# Patient Record
Sex: Female | Born: 1940
Health system: Southern US, Community
[De-identification: ages and names within clinical notes are randomized; demographics above are authoritative.]

## PROBLEM LIST (undated history)

## (undated) DIAGNOSIS — N189 Chronic kidney disease, unspecified: Secondary | ICD-10-CM

## (undated) DIAGNOSIS — K259 Gastric ulcer, unspecified as acute or chronic, without hemorrhage or perforation: Secondary | ICD-10-CM

## (undated) DIAGNOSIS — M5136 Other intervertebral disc degeneration, lumbar region: Secondary | ICD-10-CM

## (undated) DIAGNOSIS — M7580 Other shoulder lesions, unspecified shoulder: Secondary | ICD-10-CM

## (undated) DIAGNOSIS — F32A Depression, unspecified: Secondary | ICD-10-CM

## (undated) DIAGNOSIS — R920 Mammographic microcalcification found on diagnostic imaging of breast: Secondary | ICD-10-CM

## (undated) DIAGNOSIS — N6099 Unspecified benign mammary dysplasia of unspecified breast: Secondary | ICD-10-CM

## (undated) DIAGNOSIS — M1990 Unspecified osteoarthritis, unspecified site: Secondary | ICD-10-CM

## (undated) DIAGNOSIS — E785 Hyperlipidemia, unspecified: Secondary | ICD-10-CM

## (undated) DIAGNOSIS — Z780 Asymptomatic menopausal state: Secondary | ICD-10-CM

## (undated) DIAGNOSIS — K219 Gastro-esophageal reflux disease without esophagitis: Secondary | ICD-10-CM

## (undated) DIAGNOSIS — F329 Major depressive disorder, single episode, unspecified: Secondary | ICD-10-CM

## (undated) DIAGNOSIS — M51369 Other intervertebral disc degeneration, lumbar region without mention of lumbar back pain or lower extremity pain: Secondary | ICD-10-CM

## (undated) DIAGNOSIS — I10 Essential (primary) hypertension: Secondary | ICD-10-CM

## (undated) HISTORY — DX: Asymptomatic menopausal state: Z78.0

## (undated) HISTORY — DX: Other shoulder lesions, unspecified shoulder: M75.80

## (undated) HISTORY — DX: Depression, unspecified: F32.A

## (undated) HISTORY — DX: Unspecified osteoarthritis, unspecified site: M19.90

## (undated) HISTORY — DX: Hyperlipidemia, unspecified: E78.5

## (undated) HISTORY — DX: Gastro-esophageal reflux disease without esophagitis: K21.9

## (undated) HISTORY — DX: Unspecified benign mammary dysplasia of unspecified breast: N60.99

## (undated) HISTORY — DX: Chronic kidney disease, unspecified: N18.9

## (undated) HISTORY — DX: Other intervertebral disc degeneration, lumbar region: M51.36

## (undated) HISTORY — DX: Gastric ulcer, unspecified as acute or chronic, without hemorrhage or perforation: K25.9

## (undated) HISTORY — DX: Other intervertebral disc degeneration, lumbar region without mention of lumbar back pain or lower extremity pain: M51.369

## (undated) HISTORY — DX: Mammographic microcalcification found on diagnostic imaging of breast: R92.0

## (undated) HISTORY — DX: Essential (primary) hypertension: I10

---
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
## 1991-08-15 DIAGNOSIS — I10 Essential (primary) hypertension: Secondary | ICD-10-CM

## 1991-08-15 HISTORY — DX: Essential (primary) hypertension: I10

## 1997-08-14 DIAGNOSIS — M1990 Unspecified osteoarthritis, unspecified site: Secondary | ICD-10-CM

## 1997-08-14 HISTORY — DX: Unspecified osteoarthritis, unspecified site: M19.90

## 2006-06-06 ENCOUNTER — Ambulatory Visit: Payer: Self-pay | Admitting: Gastroenterology

## 2007-09-07 ENCOUNTER — Emergency Department: Payer: Self-pay | Admitting: Emergency Medicine

## 2009-04-06 ENCOUNTER — Emergency Department: Payer: Self-pay | Admitting: Unknown Physician Specialty

## 2010-10-17 LAB — HM DEXA SCAN: HM Dexa Scan: NORMAL

## 2011-08-15 DIAGNOSIS — R920 Mammographic microcalcification found on diagnostic imaging of breast: Secondary | ICD-10-CM

## 2011-08-15 HISTORY — PX: BREAST EXCISIONAL BIOPSY: SUR124

## 2011-08-15 HISTORY — DX: Mammographic microcalcification found on diagnostic imaging of breast: R92.0

## 2011-12-11 ENCOUNTER — Ambulatory Visit: Payer: Self-pay | Admitting: General Surgery

## 2011-12-13 DIAGNOSIS — N6099 Unspecified benign mammary dysplasia of unspecified breast: Secondary | ICD-10-CM

## 2011-12-13 HISTORY — DX: Unspecified benign mammary dysplasia of unspecified breast: N60.99

## 2011-12-14 LAB — PATHOLOGY REPORT

## 2011-12-25 ENCOUNTER — Ambulatory Visit: Payer: Self-pay | Admitting: General Surgery

## 2011-12-28 ENCOUNTER — Ambulatory Visit: Payer: Self-pay | Admitting: General Surgery

## 2012-01-01 LAB — PATHOLOGY REPORT

## 2012-09-12 ENCOUNTER — Encounter: Payer: Self-pay | Admitting: *Deleted

## 2012-09-12 DIAGNOSIS — R920 Mammographic microcalcification found on diagnostic imaging of breast: Secondary | ICD-10-CM | POA: Insufficient documentation

## 2012-09-12 DIAGNOSIS — I10 Essential (primary) hypertension: Secondary | ICD-10-CM | POA: Insufficient documentation

## 2012-09-20 ENCOUNTER — Encounter: Payer: Self-pay | Admitting: General Surgery

## 2012-11-04 ENCOUNTER — Ambulatory Visit (INDEPENDENT_AMBULATORY_CARE_PROVIDER_SITE_OTHER): Payer: Medicare Other | Admitting: General Surgery

## 2012-11-04 ENCOUNTER — Encounter: Payer: Self-pay | Admitting: General Surgery

## 2012-11-04 VITALS — BP 115/80 | HR 80 | Resp 14 | Ht 65.0 in | Wt 184.2 lb

## 2012-11-04 DIAGNOSIS — N6089 Other benign mammary dysplasias of unspecified breast: Secondary | ICD-10-CM

## 2012-11-04 DIAGNOSIS — N6099 Unspecified benign mammary dysplasia of unspecified breast: Secondary | ICD-10-CM

## 2012-11-04 NOTE — Progress Notes (Signed)
Patient ID: Veronica Rowe, female   DOB: [birth year], 72 y.o.   MRN: [id number]  Chief Complaint  Patient presents with  . Follow-up    mammo    HPI Veronica Rowe is a 72 y.o. female here today to follow up from her mammogram done @ [facility]. Most recent mammogram was done on 10/28/12 with a birad category 2. Patient states no new breast problems.  The patient underwent excision of an area of atypical ductal hyperplasia, intraductal papilloma and columnar cell changes on December 11, 2011. She was a candidate for chemotherapy prevention with tamoxifen, we'll with the 1-2% expected benefits she elected not to make use of this medication.  HPI  Past Medical History  Diagnosis Date  . Arthritis 1999  . Hypertension 1993  . Stomach ulcer   . Microcalcifications of the breast 2013  . Postmenopausal   . Atypical ductal hyperplasia of breast 12/2011  . DDD (degenerative disc disease), lumbar   . AC (acromioclavicular) joint bone spurs     Past Surgical History  Procedure Laterality Date  . Breast biopsy  2013    The patient's Baker Janus model risk for breast cancer is 1.4% for the next 5 years, 4.4% lifetime    Family History  Problem Relation Age of Onset  . Prostate cancer Maternal Grandfather   . Lung cancer Father   . Cirrhosis Paternal Uncle     Social History History  Substance Use Topics  . Smoking status: Never Smoker   . Smokeless tobacco: Not on file  . Alcohol Use: No    Allergies  Allergen Reactions  . Codeine Itching and Other (See Comments)    hallucinations    Current Outpatient Prescriptions  Medication Sig Dispense Refill  . ALPRAZolam (XANAX) 0.25 MG tablet Take 0.25 mg by mouth daily.      . cholecalciferol (VITAMIN D) 1000 UNITS tablet Take 1,000 Units by mouth daily.      Marland Kitchen omeprazole (PRILOSEC) 20 MG capsule Take 20 mg by mouth 2 (two) times daily.      . sertraline (ZOLOFT) 50 MG tablet Take 50 mg by mouth daily.      . simvastatin (ZOCOR) 20 MG tablet Take 20  mg by mouth every evening.      . triamterene-hydrochlorothiazide (MAXZIDE-25) 37.5-25 MG per tablet Take 1 tablet by mouth daily.       No current facility-administered medications for this visit.    Review of Systems Review of Systems  Constitutional: Negative.   Respiratory: Negative.   Cardiovascular: Negative.     Blood pressure 115/80, pulse 80, resp. rate 14, height 5\' 5"  (1.651 m), weight 184 lb 3.2 oz (83.553 kg).  Physical Exam Physical Exam  Constitutional: She appears well-developed and well-nourished.  Neck: Trachea normal. Neck supple.  Cardiovascular: Normal rate, regular rhythm and normal heart sounds.   Pulmonary/Chest: Right breast exhibits no inverted nipple, no mass, no nipple discharge, no skin change and no tenderness. Left breast exhibits no inverted nipple, no mass, no nipple discharge, no skin change and no tenderness.  Right upper outer quadrant well healed scar.    Data Reviewed Bilateral mammogram report from [facility] dated 10/28/2012 was reviewed. Postsurgical changes are noted in the right breast. Stable compared to prior exams. Scattered benign calcifications bilaterally. BI-RAD-2.  Assessment    Benign breast exam. Atypical ductal hyperplasia    Plan    A final followup with bilateral mammograms has been requested in one year.  Robert Bellow 11/07/2012, 9:43 PM

## 2012-11-14 ENCOUNTER — Encounter: Payer: Self-pay | Admitting: General Surgery

## 2013-11-05 ENCOUNTER — Encounter: Payer: Self-pay | Admitting: General Surgery

## 2013-11-11 ENCOUNTER — Encounter: Payer: Self-pay | Admitting: General Surgery

## 2013-11-11 ENCOUNTER — Ambulatory Visit (INDEPENDENT_AMBULATORY_CARE_PROVIDER_SITE_OTHER): Payer: Medicare Other | Admitting: General Surgery

## 2013-11-11 VITALS — BP 140/76 | HR 86 | Resp 14 | Ht 64.0 in | Wt 182.0 lb

## 2013-11-11 DIAGNOSIS — N6099 Unspecified benign mammary dysplasia of unspecified breast: Secondary | ICD-10-CM

## 2013-11-11 DIAGNOSIS — N6089 Other benign mammary dysplasias of unspecified breast: Secondary | ICD-10-CM

## 2013-11-11 NOTE — Progress Notes (Signed)
Patient ID: Veronica Rowe, female   DOB: 02-25-1941, 73 y.o.   MRN: [id number]  Chief Complaint  Patient presents with  . Follow-up    1 year follow up bilateral screening mammogram    HPI Veronica Rowe is a 73 y.o. female who presents for a breast cancer evaluation. The most recent mammogram was done on 11/04/13. Patient does perform regular self breast checks and gets regular mammograms done. No problems with the breasts at this time.    HPI  Past Medical History  Diagnosis Date  . Arthritis 1999  . Hypertension 1993  . Stomach ulcer   . Microcalcifications of the breast 2013  . Postmenopausal   . Atypical ductal hyperplasia of breast 12/2011  . DDD (degenerative disc disease), lumbar   . AC (acromioclavicular) joint bone spurs     Past Surgical History  Procedure Laterality Date  . Breast biopsy  2013    The patient's Baker Janus model risk for breast cancer is 1.4% for the next 5 years, 4.4% lifetime    Family History  Problem Relation Age of Onset  . Prostate cancer Maternal Grandfather   . Lung cancer Father   . Cirrhosis Paternal Uncle     Social History History  Substance Use Topics  . Smoking status: Never Smoker   . Smokeless tobacco: Not on file  . Alcohol Use: No    Allergies  Allergen Reactions  . Codeine Itching and Other (See Comments)    hallucinations    Current Outpatient Prescriptions  Medication Sig Dispense Refill  . ALPRAZolam (XANAX) 0.25 MG tablet Take 0.25 mg by mouth daily.      . cholecalciferol (VITAMIN D) 1000 UNITS tablet Take 1,000 Units by mouth daily.      . ciprofloxacin (CIPRO) 250 MG tablet Take 1 tablet by mouth 2 (two) times daily.      . clotrimazole-betamethasone (LOTRISONE) cream Apply 1 application topically as needed.      Marland Kitchen omeprazole (PRILOSEC) 20 MG capsule Take 20 mg by mouth 2 (two) times daily.      . sertraline (ZOLOFT) 50 MG tablet Take 50 mg by mouth daily.      . simvastatin (ZOCOR) 20 MG tablet Take 20 mg by  mouth every evening.      . triamterene-hydrochlorothiazide (MAXZIDE-25) 37.5-25 MG per tablet Take 1 tablet by mouth daily.       No current facility-administered medications for this visit.    Review of Systems Review of Systems  Constitutional: Negative.   Respiratory: Negative.   Cardiovascular: Negative.     Blood pressure 140/76, pulse 86, resp. rate 14, height 5\' 4"  (1.626 m), weight 182 lb (82.555 kg).  Physical Exam Physical Exam  Constitutional: She is oriented to person, place, and time. She appears well-developed and well-nourished.  Neck: Neck supple. No thyromegaly present.  Cardiovascular: Normal rate and regular rhythm.   Murmur heard.  Systolic murmur is present with a grade of 1/6  Pulmonary/Chest: Effort normal and breath sounds normal. Right breast exhibits no inverted nipple, no mass, no nipple discharge, no skin change and no tenderness. Left breast exhibits no inverted nipple, no mass, no nipple discharge, no skin change and no tenderness.  Well healed scar in the upper outer quadrant of right breast.   Lymphadenopathy:    She has no cervical adenopathy.    She has no axillary adenopathy.  Neurological: She is alert and oriented to person, place, and time.  Skin: Skin is warm and dry.  Data Reviewed Bilateral mammograms dated 11/04/2013 were reviewed. No interval change. BI-RAD-2.  Stereotactic biopsy completed 12/08/2011 showed atypical hyperplasia as well as an intraductal papilloma. Reexcision was completed on 12/28/2011 showing evidence of residual ADH with a less than 1 mm negative lateral margin. Flat epithelial atypia was appreciated.  Assessment    Atypical ductal hyperplasia without evidence of recurrence.    Plan    The patient was advised of the usual recommendation for antiestrogen therapy with the finding of ADH. We reviewed this again today. At the time of her 2013 evaluation she decided against aching use of tamoxifen to the 1-2%  absolute risk reduction.  With a clear medical and mammographic exam, she is released annual screening exams with her primary care physician. She is welcome to return at any time if she appreciate any changes in her breasts, or of imaging studies show new changes.     PCP: Halina Maidens MD  Robert Bellow 11/12/2013, 5:55 PM

## 2013-11-11 NOTE — Patient Instructions (Addendum)
Patient to return as needed. Continue self breast exams. Call office for any new breast issues or concerns.'  

## 2013-11-12 DIAGNOSIS — N6099 Unspecified benign mammary dysplasia of unspecified breast: Secondary | ICD-10-CM

## 2013-11-12 HISTORY — DX: Unspecified benign mammary dysplasia of unspecified breast: N60.99

## 2014-06-15 ENCOUNTER — Encounter: Payer: Self-pay | Admitting: General Surgery

## 2014-11-24 LAB — CBC AND DIFFERENTIAL: Hemoglobin: 14.5 g/dL (ref 12.0–16.0)

## 2014-11-24 LAB — LIPID PANEL
CHOLESTEROL: 153 mg/dL (ref 0–200)
HDL: 63 mg/dL (ref 35–70)
LDL Cholesterol: 68 mg/dL
Triglycerides: 109 mg/dL (ref 40–160)

## 2014-11-24 LAB — TSH: TSH: 1.5 u[IU]/mL (ref <=5.90)

## 2014-11-24 LAB — BASIC METABOLIC PANEL
BUN: 23 mg/dL — AB (ref 4–21)
Creatinine: 1.3 mg/dL — AB (ref <=1.1)

## 2014-12-06 NOTE — Op Note (Signed)
PATIENT NAME:  Veronica Rowe, Veronica Rowe MR#:  [id number] DATE OF BIRTH:  [birth year]  DATE OF PROCEDURE:  12/28/2011  PREOPERATIVE DIAGNOSIS: Atypical ductal hyperplasia, right breast.   POSTOPERATIVE DIAGNOSIS: Atypical ductal hyperplasia, right breast.   OPERATIVE PROCEDURE: Ultrasound-guided biopsy, right breast.   SURGEON: Robert Bellow, MD  ANESTHESIA: Attended local, 45 mL buffered 0.5% Xylocaine with 0.25% Marcaine plain.   ESTIMATED BLOOD LOSS: Less than 5 mL.   CLINICAL NOTE: This 74 year old woman underwent a stereotactic biopsy for an area of calcification in the right breast. She was found to have ADH. She was felt to be a candidate for wide local excision to look for a more advanced process. The patient had requested monitored anesthesia care.   DESCRIPTION OF PROCEDURE: With the patient under adequate sedation, the breast was prepped with ChloraPrep and draped. Ultrasound was used to confirm the previous biopsy cavity at the 10:00 position of the right breast. Local anesthesia was infiltrated and well tolerated. A skin line incision in the upper outer quadrant was carried down through the skin and subcutaneous tissue. When the adipose tissue reached the breast parenchyma a 3 cm+ block of tissue was resected. The specimen was orientated and specimen radiograph confirmed the previously placed biopsy clip within the tissue. The wound was closed in layers with interrupted 2-0 Vicryl figure-of-eight sutures in multiple layers. The skin was closed with running 4-0 Vicryl subcuticular suture. Benzoin, Steri-Strips, Telfa, and Tegaderm dressing was then applied.   Patient tolerated the procedure well and was taken to recovery room in stable condition.    ____________________________ Robert Bellow, MD jwb:cms D: 12/28/2011 12:30:44 ET T: 12/28/2011 13:01:35 ET  JOB#: [id number] cc: Robert Bellow, MD, <Dictator> Sylvie Mifsud Amedeo Kinsman MD ELECTRONICALLY SIGNED 01/01/2012 15:22

## 2014-12-16 LAB — HM MAMMOGRAPHY: HM Mammogram: NORMAL

## 2015-06-26 ENCOUNTER — Encounter: Payer: Self-pay | Admitting: Internal Medicine

## 2015-06-26 DIAGNOSIS — N183 Chronic kidney disease, stage 3 unspecified: Secondary | ICD-10-CM

## 2015-06-26 DIAGNOSIS — N393 Stress incontinence (female) (male): Secondary | ICD-10-CM

## 2015-06-26 DIAGNOSIS — E785 Hyperlipidemia, unspecified: Secondary | ICD-10-CM | POA: Insufficient documentation

## 2015-06-26 DIAGNOSIS — F324 Major depressive disorder, single episode, in partial remission: Secondary | ICD-10-CM

## 2015-06-26 DIAGNOSIS — F411 Generalized anxiety disorder: Secondary | ICD-10-CM | POA: Insufficient documentation

## 2015-06-26 DIAGNOSIS — L409 Psoriasis, unspecified: Secondary | ICD-10-CM | POA: Insufficient documentation

## 2015-06-26 DIAGNOSIS — Z8711 Personal history of peptic ulcer disease: Secondary | ICD-10-CM | POA: Insufficient documentation

## 2015-06-26 DIAGNOSIS — N184 Chronic kidney disease, stage 4 (severe): Secondary | ICD-10-CM | POA: Insufficient documentation

## 2015-07-07 ENCOUNTER — Encounter: Payer: Self-pay | Admitting: Internal Medicine

## 2015-07-07 ENCOUNTER — Ambulatory Visit (INDEPENDENT_AMBULATORY_CARE_PROVIDER_SITE_OTHER): Payer: Medicare Other | Admitting: Internal Medicine

## 2015-07-07 VITALS — BP 142/64 | HR 80 | Ht 64.0 in | Wt 150.2 lb

## 2015-07-07 DIAGNOSIS — N183 Chronic kidney disease, stage 3 unspecified: Secondary | ICD-10-CM

## 2015-07-07 DIAGNOSIS — F324 Major depressive disorder, single episode, in partial remission: Secondary | ICD-10-CM | POA: Diagnosis not present

## 2015-07-07 DIAGNOSIS — I10 Essential (primary) hypertension: Secondary | ICD-10-CM | POA: Diagnosis not present

## 2015-07-07 MED ORDER — MIRTAZAPINE 15 MG PO TBDP: 15.0000 mg | ORAL_TABLET | Freq: Every day | ORAL | Status: DC

## 2015-07-07 MED ORDER — ALPRAZOLAM 0.25 MG PO TABS: 0.2500 mg | ORAL_TABLET | Freq: Two times a day (BID) | ORAL | Status: DC | PRN

## 2015-07-07 NOTE — Progress Notes (Signed)
Date:  07/07/2015   Name:  Veronica Rowe   DOB:  1940-08-25   MRN:  [id number]   Chief Complaint: Hypertension Hypertension This is a chronic (Blood pressures have been elevated over the past several weeks up to 0000000 and 123XX123 systolic. At urgent care last week it was 190) problem. The current episode started more than 1 year ago. The problem has been gradually worsening since onset. Associated symptoms include anxiety. Pertinent negatives include no chest pain, headaches, palpitations or shortness of breath. There are no associated agents to hypertension. Past treatments include diuretics. There are no compliance problems.   Depression        This is a chronic problem.  The onset quality is gradual.   The problem occurs daily.  The problem has been gradually worsening (She is under a lot of stress caring for her chronically ill husband and her bedridden mother) since onset.  Associated symptoms include decreased concentration.  Associated symptoms include no fatigue, no headaches and no suicidal ideas.  Past treatments include SSRIs - Selective serotonin reuptake inhibitors (Recently she's had issues with sleep and decreased appetite. She is eating mainly soup. She's lost 30 pounds over the past).  Compliance with treatment is good.  Past medical history includes anxiety.       Review of Systems  Constitutional: Positive for unexpected weight change. Negative for chills, diaphoresis and fatigue.  HENT: Negative for hearing loss.   Eyes: Negative for visual disturbance.  Respiratory: Negative for choking, chest tightness and shortness of breath.   Cardiovascular: Negative for chest pain, palpitations and leg swelling.  Gastrointestinal: Negative for abdominal pain, diarrhea and constipation.  Neurological: Negative for headaches.  Psychiatric/Behavioral: Positive for depression, sleep disturbance, dysphoric mood and decreased concentration. Negative for suicidal ideas, confusion and agitation. The  patient is nervous/anxious.     Patient Active Problem List   Diagnosis Date Noted  . Chronic renal disease, stage III 06/26/2015  . Hx of peptic ulcer 06/26/2015  . Major depression in partial remission ([place]) 06/26/2015  . Generalized psoriasis 06/26/2015  . Stress incontinence, female 06/26/2015  . Dyslipidemia 06/26/2015  . Generalized anxiety disorder 06/26/2015  . Atypical hyperplasia of breast 11/12/2013  . Hypertension   . Microcalcifications of the breast     Prior to Admission medications   Medication Sig Start Date End Date Taking? Authorizing Provider  ALPRAZolam (XANAX) 0.25 MG tablet Take 0.25 mg by mouth daily.   Yes Historical Provider, MD  cholecalciferol (VITAMIN D) 1000 UNITS tablet Take 1,000 Units by mouth daily.   Yes Historical Provider, MD  clotrimazole-betamethasone (LOTRISONE) cream Apply 1 application topically as needed. 11/03/13  Yes Historical Provider, MD  omeprazole (PRILOSEC) 20 MG capsule Take 20 mg by mouth 2 (two) times daily.   Yes Historical Provider, MD  sertraline (ZOLOFT) 50 MG tablet Take 50 mg by mouth daily.   Yes Historical Provider, MD  simvastatin (ZOCOR) 20 MG tablet Take 20 mg by mouth every evening.   Yes Historical Provider, MD  triamterene-hydrochlorothiazide (MAXZIDE-25) 37.5-25 MG per tablet Take 1 tablet by mouth daily.   Yes Historical Provider, MD    Allergies  Allergen Reactions  . Codeine Itching and Other (See Comments)    hallucinations    Past Surgical History  Procedure Laterality Date  . Breast biopsy  2013    The patient's Baker Janus model risk for breast cancer is 1.4% for the next 5 years, 4.4% lifetime    Social History  Substance Use  Topics  . Smoking status: Never Smoker   . Smokeless tobacco: None  . Alcohol Use: No    Medication list has been reviewed and updated.   Physical Exam  Constitutional: She is oriented to person, place, and time. She appears well-developed and well-nourished. No distress.   HENT:  Head: Normocephalic and atraumatic.  Eyes: Right eye exhibits no discharge. Left eye exhibits no discharge. No scleral icterus.  Neck: Normal range of motion. Neck supple.  Cardiovascular: Normal rate, regular rhythm and normal heart sounds.   Pulmonary/Chest: Effort normal and breath sounds normal. No respiratory distress.  Musculoskeletal: Normal range of motion.  Neurological: She is alert and oriented to person, place, and time.  Skin: Skin is warm and dry. No rash noted.  Psychiatric: Her speech is normal and behavior is normal. Thought content normal. Her mood appears anxious.    BP 142/64 mmHg  Pulse 80  Ht 5\' 4"  (1.626 m)  Wt 150 lb 3.2 oz (68.13 kg)  BMI 25.77 kg/m2  Assessment and Plan: 1. Essential hypertension Continue current medications Monitor blood pressures at home and call if persistently over [id number] systolic  2. Chronic renal disease, stage III Continue to monitor  3. Major depressive disorder with single episode, in partial remission ([place]) Continue Zoloft and add Remeron for sleep, appetite and mood disorder - mirtazapine (REMERON SOL-TAB) 15 MG disintegrating tablet; Take 1 tablet (15 mg total) by mouth at bedtime.  Dispense: 30 tablet; Refill: 5 - ALPRAZolam (XANAX) 0.25 MG tablet; Take 1 tablet (0.25 mg total) by mouth 2 (two) times daily as needed for anxiety.  Dispense: 45 tablet; Refill: [place], MD [facility]  07/07/2015

## 2015-08-17 ENCOUNTER — Encounter: Payer: Self-pay | Admitting: Internal Medicine

## 2015-08-17 ENCOUNTER — Ambulatory Visit (INDEPENDENT_AMBULATORY_CARE_PROVIDER_SITE_OTHER): Payer: Medicare Other | Admitting: Family Medicine

## 2015-08-17 VITALS — BP 129/65 | HR 90 | Temp 97.6°F | Ht 64.0 in | Wt 147.6 lb

## 2015-08-17 DIAGNOSIS — F324 Major depressive disorder, single episode, in partial remission: Secondary | ICD-10-CM

## 2015-08-17 DIAGNOSIS — N183 Chronic kidney disease, stage 3 unspecified: Secondary | ICD-10-CM

## 2015-08-17 DIAGNOSIS — Z8711 Personal history of peptic ulcer disease: Secondary | ICD-10-CM | POA: Diagnosis not present

## 2015-08-17 DIAGNOSIS — J40 Bronchitis, not specified as acute or chronic: Secondary | ICD-10-CM

## 2015-08-17 DIAGNOSIS — E559 Vitamin D deficiency, unspecified: Secondary | ICD-10-CM | POA: Insufficient documentation

## 2015-08-17 DIAGNOSIS — E785 Hyperlipidemia, unspecified: Secondary | ICD-10-CM

## 2015-08-17 DIAGNOSIS — I10 Essential (primary) hypertension: Secondary | ICD-10-CM

## 2015-08-17 MED ORDER — DOXYCYCLINE HYCLATE 100 MG PO CAPS: 100.0000 mg | ORAL_CAPSULE | Freq: Two times a day (BID) | ORAL | Status: DC

## 2015-08-17 NOTE — Progress Notes (Signed)
Date:  08/17/2015   Name:  Veronica Rowe   DOB:  1940-12-04   MRN:  [id number]  PCP:  Halina Maidens, MD    Chief Complaint: Hypertension; Sinusitis; and Cough   History of Present Illness:  This is a 75 y.o. female with one week hx cough prod green phlegm, rhinorrhea, sinus congestion, and R otalgia. Remeron added to Zoloft to address depression associated with insomnia and weight loss, notes mood improved and sleeping better but appetite still poor, weight down 4# since last visit. On Maxide for HTN with hx CKD3, last blood work in April. Taking vit D supplement, daily Prilosec for hx peptic ulcer, and Zocor for HLD.  Review of Systems:  Review of Systems  Constitutional: Negative for fever and chills.  HENT: Negative for trouble swallowing.   Eyes: Negative for discharge.  Respiratory: Negative for shortness of breath.   Cardiovascular: Negative for chest pain and leg swelling.  Genitourinary: Negative for difficulty urinating.  Neurological: Negative for syncope and light-headedness.    Patient Active Problem List   Diagnosis Date Noted  . Essential hypertension 07/07/2015  . Major depressive disorder with single episode, in partial remission ([place]) 07/07/2015  . Chronic renal disease, stage III 06/26/2015  . Hx of peptic ulcer 06/26/2015  . Generalized psoriasis 06/26/2015  . Stress incontinence, female 06/26/2015  . Dyslipidemia 06/26/2015  . Generalized anxiety disorder 06/26/2015  . Atypical hyperplasia of breast 11/12/2013  . Microcalcifications of the breast     Prior to Admission medications   Medication Sig Start Date End Date Taking? Authorizing Provider  ALPRAZolam (XANAX) 0.25 MG tablet Take 1 tablet (0.25 mg total) by mouth 2 (two) times daily as needed for anxiety. 07/07/15  Yes Glean Hess, MD  cholecalciferol (VITAMIN D) 1000 UNITS tablet Take 1,000 Units by mouth daily.   Yes Historical Provider, MD  clotrimazole-betamethasone (LOTRISONE) cream Apply  1 application topically as needed. 11/03/13  Yes Historical Provider, MD  fluticasone (FLONASE) 50 MCG/ACT nasal spray Place 2 sprays into both nostrils daily. 08/11/15  Yes Historical Provider, MD  mirtazapine (REMERON SOL-TAB) 15 MG disintegrating tablet Take 1 tablet (15 mg total) by mouth at bedtime. 07/07/15  Yes Glean Hess, MD  omeprazole (PRILOSEC) 20 MG capsule Take 20 mg by mouth 2 (two) times daily.   Yes Historical Provider, MD  sertraline (ZOLOFT) 50 MG tablet Take 50 mg by mouth daily.   Yes Historical Provider, MD  simvastatin (ZOCOR) 20 MG tablet Take 20 mg by mouth every evening.   Yes Historical Provider, MD  triamterene-hydrochlorothiazide (MAXZIDE-25) 37.5-25 MG per tablet Take 1 tablet by mouth daily.   Yes Historical Provider, MD  doxycycline (VIBRAMYCIN) 100 MG capsule Take 1 capsule (100 mg total) by mouth 2 (two) times daily. 08/17/15   Adline Potter, MD    Allergies  Allergen Reactions  . Codeine Itching and Other (See Comments)    hallucinations    Past Surgical History  Procedure Laterality Date  . Breast biopsy  2013    The patient's Baker Janus model risk for breast cancer is 1.4% for the next 5 years, 4.4% lifetime    Social History  Substance Use Topics  . Smoking status: Never Smoker   . Smokeless tobacco: None  . Alcohol Use: No    Family History  Problem Relation Age of Onset  . Prostate cancer Maternal Grandfather   . Lung cancer Father   . Cirrhosis Paternal Uncle     Medication list has  been reviewed and updated.  Physical Examination: BP 129/65 mmHg  Pulse 90  Temp(Src) 97.6 F (36.4 C)  Ht 5\' 4"  (1.626 m)  Wt 147 lb 9.6 oz (66.951 kg)  BMI 25.32 kg/m2  SpO2 99%  Physical Exam  Constitutional: She appears well-developed and well-nourished.  HENT:  Right Ear: External ear normal.  Left Ear: External ear normal.  Nose: Nose normal.  Mouth/Throat: Oropharynx is clear and moist.  TM's clear  Neck: Neck supple.  Cardiovascular:  Normal rate, regular rhythm and normal heart sounds.   Pulmonary/Chest: Effort normal and breath sounds normal.  Musculoskeletal: She exhibits no edema.  Lymphadenopathy:    She has no cervical adenopathy.  Neurological: She is alert.  Skin: Skin is warm and dry.  Psychiatric: She has a normal mood and affect. Her behavior is normal.  Nursing note and vitals reviewed.   Assessment and Plan:  1. Bronchitis, persistent Doxy x 7d, call if sxs worsen/persist - doxycycline (VIBRAMYCIN) 100 MG capsule; Take 1 capsule (100 mg total) by mouth 2 (two) times daily.  Dispense: 14 capsule; Refill: 0  2. Essential hypertension Well controlled on current regimen  3. Major depressive disorder with single episode, in partial remission ([facility]) Overall sxs improved with addition of Remeron to Zoloft one month ago, continue current regimen, expect further improvement over next several months  4. Chronic renal disease, stage III Declined blood work today, says gets with her CPE in April, consider ACEI/ARB in place of diuretic next visit  5. Hx of peptic ulcer On chronic PPI, consider change to H2B next visit  6. Dyslipidemia On Zocor, unclear indication for rx given lack of established vascular dz  7. Vitamin D deficiency On supplementation, consider recheck level next visit  Return in about 3 months (around 11/15/2015).  Satira Anis. [facility]  08/17/2015

## 2015-09-10 ENCOUNTER — Other Ambulatory Visit: Payer: Self-pay | Admitting: Internal Medicine

## 2015-09-10 DIAGNOSIS — F324 Major depressive disorder, single episode, in partial remission: Secondary | ICD-10-CM

## 2015-09-10 MED ORDER — ALPRAZOLAM 0.25 MG PO TABS: 0.2500 mg | ORAL_TABLET | Freq: Two times a day (BID) | ORAL | Status: DC | PRN

## 2015-12-13 ENCOUNTER — Other Ambulatory Visit: Payer: Self-pay | Admitting: Internal Medicine

## 2015-12-13 ENCOUNTER — Other Ambulatory Visit: Payer: Self-pay

## 2015-12-13 DIAGNOSIS — F324 Major depressive disorder, single episode, in partial remission: Secondary | ICD-10-CM

## 2015-12-13 MED ORDER — SERTRALINE HCL 50 MG PO TABS: 50.0000 mg | ORAL_TABLET | Freq: Every day | ORAL | Status: DC

## 2015-12-13 MED ORDER — SIMVASTATIN 20 MG PO TABS: 20.0000 mg | ORAL_TABLET | Freq: Every evening | ORAL | Status: DC

## 2015-12-13 MED ORDER — TRIAMTERENE-HCTZ 37.5-25 MG PO TABS: 1.0000 | ORAL_TABLET | Freq: Every day | ORAL | Status: DC

## 2015-12-13 MED ORDER — ALPRAZOLAM 0.25 MG PO TABS: 0.2500 mg | ORAL_TABLET | Freq: Two times a day (BID) | ORAL | Status: DC | PRN

## 2015-12-13 NOTE — Telephone Encounter (Signed)
Called for refill sent to [facility]. [facility]

## 2016-01-04 ENCOUNTER — Ambulatory Visit (INDEPENDENT_AMBULATORY_CARE_PROVIDER_SITE_OTHER): Payer: Medicare Other | Admitting: Internal Medicine

## 2016-01-04 ENCOUNTER — Encounter: Payer: Self-pay | Admitting: Internal Medicine

## 2016-01-04 VITALS — BP 170/80 | HR 76 | Resp 16 | Ht 64.0 in | Wt 152.4 lb

## 2016-01-04 DIAGNOSIS — I10 Essential (primary) hypertension: Secondary | ICD-10-CM

## 2016-01-04 DIAGNOSIS — N183 Chronic kidney disease, stage 3 unspecified: Secondary | ICD-10-CM

## 2016-01-04 DIAGNOSIS — Z23 Encounter for immunization: Secondary | ICD-10-CM

## 2016-01-04 DIAGNOSIS — Z1239 Encounter for other screening for malignant neoplasm of breast: Secondary | ICD-10-CM | POA: Diagnosis not present

## 2016-01-04 DIAGNOSIS — E785 Hyperlipidemia, unspecified: Secondary | ICD-10-CM

## 2016-01-04 DIAGNOSIS — Z0000 Encounter for general adult medical examination without abnormal findings: Secondary | ICD-10-CM

## 2016-01-04 DIAGNOSIS — F324 Major depressive disorder, single episode, in partial remission: Secondary | ICD-10-CM

## 2016-01-04 DIAGNOSIS — Z0001 Encounter for general adult medical examination with abnormal findings: Secondary | ICD-10-CM

## 2016-01-04 DIAGNOSIS — Z8711 Personal history of peptic ulcer disease: Secondary | ICD-10-CM

## 2016-01-04 LAB — POCT URINALYSIS DIPSTICK
Bilirubin, UA: NEGATIVE
Blood, UA: NEGATIVE
Glucose, UA: NEGATIVE
KETONES UA: NEGATIVE
LEUKOCYTES UA: NEGATIVE
NITRITE UA: NEGATIVE
PH UA: 7
PROTEIN UA: NEGATIVE
Spec Grav, UA: 1.01
UROBILINOGEN UA: 0.2

## 2016-01-04 MED ORDER — LOSARTAN POTASSIUM 100 MG PO TABS: 100.0000 mg | ORAL_TABLET | Freq: Every day | ORAL | Status: DC

## 2016-01-04 NOTE — Progress Notes (Signed)
Patient: Veronica Rowe, Female    DOB: 1941-03-31, 75 y.o.   MRN: [id number] Visit Date: 01/04/2016  Today's Provider: Halina Maidens, MD   Chief Complaint  Patient presents with  . Medicare Wellness  . Hypertension  . Hyperlipidemia  . Depression   Subjective:    Annual wellness visit Veronica Rowe is a 75 y.o. female who presents today for her Subsequent Annual Wellness Visit. She feels fairly well. She reports exercising none. She reports she is sleeping fairly well.  She is spending a lot of time caring for her husband and her elderly mother.  She has not had much time for herself and it is affecting her mood and appetite. ----------------------------------------------------------- Hypertension This is a chronic problem. The current episode started more than 1 year ago. The problem has been waxing and waning since onset. The problem is uncontrolled (sys BP at home always >150). Pertinent negatives include no chest pain, headaches, palpitations or shortness of breath. Past treatments include diuretics. The current treatment provides mild improvement. There are no compliance problems.  Identifiable causes of hypertension include chronic renal disease.  Hyperlipidemia This is a chronic problem. The current episode started more than 1 year ago. The problem is controlled. Exacerbating diseases include chronic renal disease. She has no history of diabetes. Pertinent negatives include no chest pain or shortness of breath. Current antihyperlipidemic treatment includes statins.  Depression        This is a chronic problem.The problem is unchanged.  Associated symptoms include fatigue, decreased interest and appetite change.  Associated symptoms include no restlessness, no headaches and no suicidal ideas.     The symptoms are aggravated by family issues and social issues.  Past treatments include SSRIs - Selective serotonin reuptake inhibitors (and added remeron last visit but pt stopped due  to feeling drugged when taking it at night with xanax).   Review of Systems  Constitutional: Positive for chills, appetite change and fatigue. Negative for fever and unexpected weight change.  HENT: Negative for congestion, hearing loss, tinnitus, trouble swallowing and voice change.   Eyes: Negative for visual disturbance.  Respiratory: Negative for cough, chest tightness, shortness of breath and wheezing.   Cardiovascular: Negative for chest pain, palpitations and leg swelling.  Gastrointestinal: Negative for vomiting, abdominal pain, diarrhea and constipation.  Endocrine: Negative for polydipsia and polyuria.  Genitourinary: Negative for dysuria, frequency, vaginal bleeding, vaginal discharge and genital sores.  Musculoskeletal: Negative for joint swelling, arthralgias and gait problem.  Skin: Negative for color change and rash.  Neurological: Negative for dizziness, tremors, light-headedness and headaches.  Hematological: Negative for adenopathy. Does not bruise/bleed easily.  Psychiatric/Behavioral: Positive for depression, sleep disturbance and dysphoric mood. Negative for suicidal ideas and confusion. The patient is nervous/anxious.     Social History   Social History  . Marital Status: Married    Spouse Name: N/A  . Number of Children: N/A  . Years of Education: N/A   Occupational History  . Not on file.   Social History Main Topics  . Smoking status: Never Smoker   . Smokeless tobacco: Not on file  . Alcohol Use: No  . Drug Use: No  . Sexual Activity: Not on file   Other Topics Concern  . Not on file   Social History Narrative    Patient Active Problem List   Diagnosis Date Noted  . Vitamin D deficiency 08/17/2015  . Essential hypertension 07/07/2015  . Major depressive disorder with single episode, in partial remission (  [place]) 07/07/2015  . Chronic renal disease, stage III 06/26/2015  . Hx of peptic ulcer 06/26/2015  . Generalized psoriasis 06/26/2015  .  Stress incontinence, female 06/26/2015  . Dyslipidemia 06/26/2015  . Generalized anxiety disorder 06/26/2015  . Atypical hyperplasia of breast 11/12/2013  . Microcalcifications of the breast     Past Surgical History  Procedure Laterality Date  . Breast biopsy  2013    The patient's Baker Janus model risk for breast cancer is 1.4% for the next 5 years, 4.4% lifetime    Her family history includes Cirrhosis in her paternal uncle; Lung cancer in her father; Prostate cancer in her maternal grandfather.    Previous Medications   ALPRAZOLAM (XANAX) 0.25 MG TABLET    Take 1 tablet (0.25 mg total) by mouth 2 (two) times daily as needed for anxiety.   CHOLECALCIFEROL (VITAMIN D) 1000 UNITS TABLET    Take 1,000 Units by mouth daily.   CLOTRIMAZOLE-BETAMETHASONE (LOTRISONE) CREAM    Apply 1 application topically as needed.   FLUTICASONE (FLONASE) 50 MCG/ACT NASAL SPRAY    Place 2 sprays into both nostrils daily.   MIRTAZAPINE (REMERON) 15 MG TABLET    7.5 mg. Taking 1/2 Only   OMEPRAZOLE (PRILOSEC) 20 MG CAPSULE    Take 20 mg by mouth 2 (two) times daily.   SERTRALINE (ZOLOFT) 50 MG TABLET    Take 1 tablet (50 mg total) by mouth daily.   SIMVASTATIN (ZOCOR) 20 MG TABLET    Take 1 tablet (20 mg total) by mouth every evening.   TRIAMTERENE-HYDROCHLOROTHIAZIDE (MAXZIDE-25) 37.5-25 MG TABLET    Take 1 tablet by mouth daily.    Patient Care Team: Glean Hess, MD as PCP - General (Internal Medicine) Robert Bellow, MD (General Surgery)     Objective:   Vitals: BP 170/80 mmHg  Pulse 76  Resp 16  Ht 5\' 4"  (1.626 m)  Wt 152 lb 6.4 oz (69.128 kg)  BMI 26.15 kg/m2  SpO2 97%  Physical Exam  Constitutional: She is oriented to person, place, and time. She appears well-developed and well-nourished. No distress.  HENT:  Head: Normocephalic and atraumatic.  Right Ear: Tympanic membrane and ear canal normal.  Left Ear: Tympanic membrane and ear canal normal.  Nose: Right sinus exhibits no  maxillary sinus tenderness. Left sinus exhibits no maxillary sinus tenderness.  Mouth/Throat: Uvula is midline and oropharynx is clear and moist.  Eyes: Conjunctivae and EOM are normal. Right eye exhibits no discharge. Left eye exhibits no discharge. No scleral icterus.  Neck: Normal range of motion. Carotid bruit is not present. No erythema present. No thyromegaly present.  Cardiovascular: Normal rate, regular rhythm, normal heart sounds and normal pulses.   Pulmonary/Chest: Effort normal. No respiratory distress. She has decreased breath sounds. She has no wheezes. She has no rhonchi. Right breast exhibits no mass, no nipple discharge, no skin change and no tenderness. Left breast exhibits no mass, no nipple discharge, no skin change and no tenderness.  Abdominal: Soft. Bowel sounds are normal. There is no hepatosplenomegaly. There is no tenderness. There is no CVA tenderness.  Musculoskeletal: Normal range of motion.  Lymphadenopathy:    She has no cervical adenopathy.    She has no axillary adenopathy.  Neurological: She is alert and oriented to person, place, and time. She has normal reflexes. No cranial nerve deficit or sensory deficit.  Skin: Skin is warm, dry and intact. No rash noted.  Psychiatric: Her speech is normal and behavior is normal.  Judgment and thought content normal. Her mood appears anxious. Cognition and memory are normal.  Nursing note and vitals reviewed.   Activities of Daily Living In your present state of health, do you have any difficulty performing the following activities: 01/04/2016 08/17/2015  Hearing? Tempie Donning  Vision? N N  Difficulty concentrating or making decisions? N N  Walking or climbing stairs? N Y  Dressing or bathing? N N  Doing errands, shopping? N N  Preparing Food and eating ? N -  Using the Toilet? N -  In the past six months, have you accidently leaked urine? Y -  Do you have problems with loss of bowel control? N -  Managing your Medications? N -    Managing your Finances? N -  Housekeeping or managing your Housekeeping? N -    Fall Risk Assessment Fall Risk  01/04/2016 08/17/2015  Falls in the past year? No No      Depression Screen PHQ 2/9 Scores 01/04/2016 08/17/2015  PHQ - 2 Score 4 0  PHQ- 9 Score 14 -    Cognitive Testing - 6-CIT   Correct? Score   What year is it? yes 0 Yes = 0    No = 4  What month is it? yes 0 Yes = 0    No = 3  Remember:     Pia [place], [place], [place]     What time is it? yes 0 Yes = 0    No = 3  Count backwards from 20 to 1 yes 0 Correct = 0    1 error = 2   More than 1 error = 4  Say the months of the year in reverse. yes 0 Correct = 0    1 error = 2   More than 1 error = 4  What address did I ask you to remember? yes 0 Correct = 0  1 error = 2    2 error = 4    3 error = 6    4 error = 8    All wrong = 10       TOTAL SCORE  0/28   Interpretation:  Normal  Normal (0-7) Abnormal (8-28)        Medicare Annual Wellness Visit Summary:  Reviewed patient's Family Medical History Reviewed and updated list of patient's medical providers Assessment of cognitive impairment was done Assessed patient's functional ability Established a written schedule for health screening [place] Completed and Reviewed  Exercise Activities and Dietary recommendations Goals    None      Immunization History  Administered Date(s) Administered  . Pneumococcal Conjugate-13 01/04/2016  . Pneumococcal Polysaccharide-23 10/05/2011  . Tdap 10/28/2013    Health Maintenance  Topic Date Due  . ZOSTAVAX  04/03/2003  . DEXA SCAN  04/02/2008  . PNA vac Low Risk Adult (2 of 2 - PCV13) 10/04/2012  . COLONOSCOPY  08/14/2016 (Originally 09/13/2015)  . INFLUENZA VACCINE  08/16/2016 (Originally 03/14/2016)  . MAMMOGRAM  12/15/2016  . TETANUS/TDAP  10/29/2023     Discussed health benefits of physical activity, and encouraged her to engage in regular exercise appropriate for her age and  condition.    ------------------------------------------------------------------------------------------------------------   Assessment & Plan:  1. Annual physical exam MAW measures satisfied - POCT urinalysis dipstick  2. Breast cancer screening - MM DIGITAL SCREENING BILATERAL; Future  3. Need for pneumococcal vaccination - Pneumococcal conjugate vaccine 13-valent IM  4. Major depressive disorder  with single episode, in partial remission (HCC) Resume Remeron 7.5 mg at hs Hold xanax at hs  5. Essential hypertension Not controlled - TSH - losartan (COZAAR) 100 MG tablet; Take 1 tablet (100 mg total) by mouth daily.  Dispense: 30 tablet; Refill: 5  6. Chronic renal disease, stage III - Comprehensive metabolic panel Avoid NSAIDS  7. Dyslipidemia On statin therapy - Lipid panel  8. Hx of peptic ulcer Continue PPI - CBC with Differential/Platelet   Halina Maidens, MD [facility]  01/04/2016

## 2016-01-04 NOTE — Patient Instructions (Addendum)
Stop alprazolam (xanax) at night - instead take one whole Mirtazapine (Remeron)  Start Losartan 100 mg once a day for blood pressure  Pneumococcal Conjugate Vaccine (PCV13)  1. Why get vaccinated? Vaccination can protect both children and adults from pneumococcal disease. Pneumococcal disease is caused by bacteria that can spread from person to person through close contact. It can cause ear infections, and it can also lead to more serious infections of the:  Lungs (pneumonia),  Blood (bacteremia), and  Covering of the brain and spinal cord (meningitis). Pneumococcal pneumonia is most common among adults. Pneumococcal meningitis can cause deafness and brain damage, and it kills about 1 child in 10 who get it. Anyone can get pneumococcal disease, but children under 34 years of age and adults 38 years and older, people with certain medical conditions, and cigarette smokers are at the highest risk. Before there was a vaccine, the [place] saw:  more than 700 cases of meningitis,  about 13,000 blood infections,  about 5 million ear infections, and  about 200 deaths in children under 5 each year from pneumococcal disease. Since vaccine became available, severe pneumococcal disease in these children has fallen by 88%. About 18,000 older adults die of pneumococcal disease each year in the [place]. Treatment of pneumococcal infections with penicillin and other drugs is not as effective as it used to be, because some strains of the disease have become resistant to these drugs. This makes prevention of the disease, through vaccination, even more important. 2. PCV13 vaccine Pneumococcal conjugate vaccine (called PCV13) protects against 13 types of pneumococcal bacteria. PCV13 is routinely given to children at 2, 4, 6, and 25-69 months of age. It is also recommended for children and adults 88 to 17 years of age with certain health conditions, and for all adults 26 years of age and older.  Your doctor can give you details. 3. Some people should not get this vaccine Anyone who has ever had a life-threatening allergic reaction to a dose of this vaccine, to an earlier pneumococcal vaccine called PCV7, or to any vaccine containing diphtheria toxoid (for example, DTaP), should not get PCV13. Anyone with a severe allergy to any component of PCV13 should not get the vaccine. Tell your doctor if the person being vaccinated has any severe allergies. If the person scheduled for vaccination is not feeling well, your healthcare provider might decide to reschedule the shot on another day. 4. Risks of a vaccine reaction With any medicine, including vaccines, there is a chance of reactions. These are usually mild and go away on their own, but serious reactions are also possible. Problems reported following PCV13 varied by age and dose in the series. The most common problems reported among children were:  About half became drowsy after the shot, had a temporary loss of appetite, or had redness or tenderness where the shot was given.  About 1 out of 3 had swelling where the shot was given.  About 1 out of 3 had a mild fever, and about 1 in 20 had a fever over 102.56F.  Up to about 8 out of 10 became fussy or irritable. Adults have reported pain, redness, and swelling where the shot was given; also mild fever, fatigue, headache, chills, or muscle pain. Young children who get PCV13 along with inactivated flu vaccine at the same time may be at increased risk for seizures caused by fever. Ask your doctor for more information. Problems that could happen after any vaccine:  People sometimes faint  after a medical procedure, including vaccination. Sitting or lying down for about 15 minutes can help prevent fainting, and injuries caused by a fall. Tell your doctor if you feel dizzy, or have vision changes or ringing in the ears.  Some older children and adults get severe pain in the shoulder and have  difficulty moving the arm where a shot was given. This happens very rarely.  Any medication can cause a severe allergic reaction. Such reactions from a vaccine are very rare, estimated at about 1 in a million doses, and would happen within a few minutes to a few hours after the vaccination. As with any medicine, there is a very small chance of a vaccine causing a serious injury or death. The safety of vaccines is always being monitored. For more information, visit: http://www.aguilar.org/ 5. What if there is a serious reaction? What should I look for?  Look for anything that concerns you, such as signs of a severe allergic reaction, very high fever, or unusual behavior. Signs of a severe allergic reaction can include hives, swelling of the face and throat, difficulty breathing, a fast heartbeat, dizziness, and weakness-usually within a few minutes to a few hours after the vaccination. What should I do?  If you think it is a severe allergic reaction or other emergency that can't wait, call 9-1-1 or get the person to the nearest hospital. Otherwise, call your doctor. Reactions should be reported to the Vaccine Adverse Event Reporting System (VAERS). Your doctor should file this report, or you can do it yourself through the VAERS web site at www.vaers.SamedayNews.es, or by calling 272-163-0210. VAERS does not give medical advice. 6. The National Vaccine Injury Compensation Program The [employer] Vaccine Injury Compensation Program (VICP) is a federal program that was created to compensate people who may have been injured by certain vaccines. Persons who believe they may have been injured by a vaccine can learn about the program and about filing a claim by calling 207-176-1814 or visiting the [place] website at GoldCloset.com.ee. There is a time limit to file a claim for compensation. 7. How can I learn more?  Ask your healthcare provider. He or she can give you the vaccine package insert  or suggest other sources of information.  Call your local or state health department.  Contact the Centers for Disease Control and Prevention (CDC):  Call 325-299-0513 (1-800-CDC-INFO) or  Visit CDC's website at http://hunter.com/ Vaccine Information Statement PCV13 Vaccine (06/18/2014)   This information is not intended to replace advice given to you by your health care provider. Make sure you discuss any questions you have with your health care provider.   Document Released: 05/28/2006 Document Revised: 08/21/2014 Document Reviewed: 06/25/2014 Elsevier Interactive Patient Education 2016 [place] Maintenance  Topic Date Due  . ZOSTAVAX  04/03/2003  . DEXA SCAN  04/02/2008  . PNA vac Low Risk Adult (2 of 2 - PCV13) 10/04/2012  . COLONOSCOPY  08/14/2016 (Originally 09/13/2015)  . INFLUENZA VACCINE  08/16/2016 (Originally 03/14/2016)  . MAMMOGRAM  12/15/2016  . TETANUS/TDAP  10/29/2023

## 2016-01-05 LAB — CBC WITH DIFFERENTIAL/PLATELET
BASOS ABS: 0 10*3/uL (ref 0.0–0.2)
Basos: 1 %
EOS (ABSOLUTE): 0.3 10*3/uL (ref 0.0–0.4)
Eos: 4 %
HEMOGLOBIN: 14 g/dL (ref 11.1–15.9)
Hematocrit: 41.1 % (ref 34.0–46.6)
IMMATURE GRANS (ABS): 0 10*3/uL (ref 0.0–0.1)
IMMATURE GRANULOCYTES: 0 %
LYMPHS: 24 %
Lymphocytes Absolute: 2 10*3/uL (ref 0.7–3.1)
MCH: 29.2 pg (ref 26.6–33.0)
MCHC: 34.1 g/dL (ref 31.5–35.7)
MCV: 86 fL (ref 79–97)
MONOCYTES: 7 %
Monocytes Absolute: 0.6 10*3/uL (ref 0.1–0.9)
NEUTROS ABS: 5.4 10*3/uL (ref 1.4–7.0)
NEUTROS PCT: 64 %
Platelets: 259 10*3/uL (ref 150–379)
RBC: 4.79 x10E6/uL (ref 3.77–5.28)
RDW: 13.8 % (ref 12.3–15.4)
WBC: 8.4 10*3/uL (ref 3.4–10.8)

## 2016-01-05 LAB — COMPREHENSIVE METABOLIC PANEL
ALBUMIN: 4.5 g/dL (ref 3.5–4.8)
ALT: 7 IU/L (ref 0–32)
AST: 16 IU/L (ref 0–40)
Albumin/Globulin Ratio: 1.6 (ref 1.2–2.2)
Alkaline Phosphatase: 81 IU/L (ref 39–117)
BILIRUBIN TOTAL: 0.4 mg/dL (ref 0.0–1.2)
BUN/Creatinine Ratio: 23 (ref 12–28)
BUN: 26 mg/dL (ref 8–27)
CALCIUM: 9.6 mg/dL (ref 8.7–10.3)
CO2: 27 mmol/L (ref 18–29)
CREATININE: 1.11 mg/dL — AB (ref 0.57–1.00)
Chloride: 97 mmol/L (ref 96–106)
GFR, EST AFRICAN AMERICAN: 57 mL/min/{1.73_m2} — AB (ref >59)
GFR, EST NON AFRICAN AMERICAN: 50 mL/min/{1.73_m2} — AB (ref >59)
GLUCOSE: 76 mg/dL (ref 65–99)
Globulin, Total: 2.8 g/dL (ref 1.5–4.5)
Potassium: 3.7 mmol/L (ref 3.5–5.2)
Sodium: 143 mmol/L (ref 134–144)
TOTAL PROTEIN: 7.3 g/dL (ref 6.0–8.5)

## 2016-01-05 LAB — TSH: TSH: 2.67 u[IU]/mL (ref 0.450–4.500)

## 2016-01-05 LAB — LIPID PANEL
CHOL/HDL RATIO: 2.9 ratio (ref 0.0–4.4)
Cholesterol, Total: 164 mg/dL (ref 100–199)
HDL: 56 mg/dL (ref >39)
LDL CALC: 80 mg/dL (ref 0–99)
Triglycerides: 138 mg/dL (ref 0–149)
VLDL CHOLESTEROL CAL: 28 mg/dL (ref 5–40)

## 2016-02-01 ENCOUNTER — Telehealth: Payer: Self-pay

## 2016-02-01 NOTE — Telephone Encounter (Signed)
[place] tech - called to obtain refills on Omeprazole 20 mg

## 2016-02-02 ENCOUNTER — Other Ambulatory Visit: Payer: Self-pay

## 2016-02-02 MED ORDER — OMEPRAZOLE 20 MG PO CPDR
20.0000 mg | DELAYED_RELEASE_CAPSULE | Freq: Two times a day (BID) | ORAL | Status: DC
Start: 2016-02-02 — End: 2016-07-19

## 2016-02-11 ENCOUNTER — Other Ambulatory Visit: Payer: Self-pay | Admitting: Internal Medicine

## 2016-03-07 ENCOUNTER — Ambulatory Visit (INDEPENDENT_AMBULATORY_CARE_PROVIDER_SITE_OTHER): Payer: BC Managed Care – PPO | Admitting: Internal Medicine

## 2016-03-07 ENCOUNTER — Encounter: Payer: Self-pay | Admitting: Internal Medicine

## 2016-03-07 VITALS — BP 146/68 | HR 76 | Ht 64.0 in | Wt 152.4 lb

## 2016-03-07 DIAGNOSIS — I10 Essential (primary) hypertension: Secondary | ICD-10-CM

## 2016-03-07 DIAGNOSIS — N183 Chronic kidney disease, stage 3 unspecified: Secondary | ICD-10-CM

## 2016-03-07 DIAGNOSIS — M47816 Spondylosis without myelopathy or radiculopathy, lumbar region: Secondary | ICD-10-CM

## 2016-03-07 DIAGNOSIS — F411 Generalized anxiety disorder: Secondary | ICD-10-CM

## 2016-03-07 DIAGNOSIS — F324 Major depressive disorder, single episode, in partial remission: Secondary | ICD-10-CM | POA: Diagnosis not present

## 2016-03-07 DIAGNOSIS — E785 Hyperlipidemia, unspecified: Secondary | ICD-10-CM | POA: Diagnosis not present

## 2016-03-07 MED ORDER — ALPRAZOLAM 0.25 MG PO TABS: 0.2500 mg | ORAL_TABLET | Freq: Two times a day (BID) | ORAL | 5 refills | Status: DC | PRN

## 2016-03-07 MED ORDER — MELOXICAM 7.5 MG PO TABS: 7.5000 mg | ORAL_TABLET | Freq: Every day | ORAL | 5 refills | Status: DC

## 2016-03-07 NOTE — Progress Notes (Signed)
Date:  03/07/2016   Name:  Veronica Rowe   DOB:  [birth year]   MRN:  [id number]   Chief Complaint: Hypertension; Depression; and Hyperlipidemia Hypertension  This is a chronic problem. The current episode started more than 1 year ago. The problem is unchanged. The problem is controlled. Associated symptoms include anxiety and neck pain. Pertinent negatives include no chest pain, palpitations or shortness of breath.  Hyperlipidemia  This is a chronic problem. The current episode started more than 1 year ago. The problem is controlled. Recent lipid tests were reviewed and are normal. Associated symptoms include myalgias. Pertinent negatives include no chest pain or shortness of breath. Current antihyperlipidemic treatment includes statins.  Anxiety  Presents for follow-up visit. Symptoms include excessive worry, insomnia and nervous/anxious behavior. Patient reports no chest pain, palpitations or shortness of breath. Symptoms occur constantly. The quality of sleep is fair.    Back Pain  This is a chronic problem. The problem occurs constantly. The problem has been waxing and waning since onset. The pain is present in the lumbar spine. The quality of the pain is described as aching. The pain does not radiate. The pain is moderate. Pertinent negatives include no chest pain, dysuria, fever, numbness or weakness. She has tried chiropractic manipulation, ice and NSAIDs for the symptoms. The treatment provided moderate relief.    Review of Systems  Constitutional: Negative for appetite change, fever and unexpected weight change.  HENT: Negative for tinnitus, trouble swallowing and voice change.   Eyes: Negative for visual disturbance.  Respiratory: Negative for chest tightness and shortness of breath.   Cardiovascular: Negative for chest pain, palpitations and leg swelling.  Gastrointestinal: Negative for blood in stool and constipation.  Genitourinary: Negative for dysuria.  Musculoskeletal:  Positive for back pain, myalgias and neck pain.  Neurological: Negative for weakness and numbness.  Hematological: Negative for adenopathy.  Psychiatric/Behavioral: The patient is nervous/anxious and has insomnia.     Patient Active Problem List   Diagnosis Date Noted  . Vitamin D deficiency 08/17/2015  . Essential hypertension 07/07/2015  . Major depressive disorder with single episode, in partial remission ([place]) 07/07/2015  . Chronic renal disease, stage III 06/26/2015  . Hx of peptic ulcer 06/26/2015  . Generalized psoriasis 06/26/2015  . Stress incontinence, female 06/26/2015  . Dyslipidemia 06/26/2015  . Generalized anxiety disorder 06/26/2015  . Atypical hyperplasia of breast 11/12/2013  . Microcalcifications of the breast     Prior to Admission medications   Medication Sig Start Date End Date Taking? Authorizing Provider  ALPRAZolam Duanne Moron) 0.25 MG tablet TAKE ONE TABLET BY MOUTH TWICE DAILY AS NEEDED FOR ANXIETY 02/11/16  Yes Glean Hess, MD  cholecalciferol (VITAMIN D) 1000 UNITS tablet Take 1,000 Units by mouth daily.   Yes Historical Provider, MD  clotrimazole-betamethasone (LOTRISONE) cream Apply 1 application topically as needed. 11/03/13  Yes Historical Provider, MD  fluticasone (FLONASE) 50 MCG/ACT nasal spray Place 2 sprays into both nostrils daily. 08/11/15  Yes Historical Provider, MD  losartan (COZAAR) 100 MG tablet Take 1 tablet (100 mg total) by mouth daily. 01/04/16  Yes Glean Hess, MD  mirtazapine (REMERON) 15 MG tablet 7.5 mg. Taking 1/2 Only 10/13/15  Yes Historical Provider, MD  omeprazole (PRILOSEC) 20 MG capsule Take 1 capsule (20 mg total) by mouth 2 (two) times daily. 02/02/16  Yes Glean Hess, MD  sertraline (ZOLOFT) 50 MG tablet Take 1 tablet (50 mg total) by mouth daily. 12/13/15  Yes Jesse Sans  Army Melia, MD  simvastatin (ZOCOR) 20 MG tablet Take 1 tablet (20 mg total) by mouth every evening. 12/13/15  Yes Glean Hess, MD    triamterene-hydrochlorothiazide (MAXZIDE-25) 37.5-25 MG tablet Take 1 tablet by mouth daily. 12/13/15  Yes Glean Hess, MD    Allergies  Allergen Reactions  . Codeine Itching and Other (See Comments)    hallucinations    Past Surgical History:  Procedure Laterality Date  . BREAST BIOPSY  2013   The patient's Baker Janus model risk for breast cancer is 1.4% for the next 5 years, 4.4% lifetime    Social History  Substance Use Topics  . Smoking status: Never Smoker  . Smokeless tobacco: Not on file  . Alcohol use No     Medication list has been reviewed and updated.   Physical Exam  Constitutional: She is oriented to person, place, and time. She appears well-developed. No distress.  HENT:  Head: Normocephalic and atraumatic.  Eyes: Pupils are equal, round, and reactive to light.  Neck: Normal range of motion. Neck supple. No thyromegaly present.  Cardiovascular: Normal rate, regular rhythm and normal heart sounds.   Pulmonary/Chest: Effort normal and breath sounds normal. No respiratory distress.  Abdominal: Soft.  Musculoskeletal: Normal range of motion. She exhibits no edema or deformity.       Cervical back: She exhibits tenderness and spasm. She exhibits normal range of motion.  Lymphadenopathy:    She has no cervical adenopathy.  Neurological: She is alert and oriented to person, place, and time. She has normal strength and normal reflexes. No cranial nerve deficit or sensory deficit.  Skin: Skin is warm and dry. No rash noted.  Psychiatric: She has a normal mood and affect. Her behavior is normal. Thought content normal.  Nursing note and vitals reviewed.   BP (!) 146/68   Pulse 76   Ht 5\' 4"  (1.626 m)   Wt 152 lb 6.4 oz (69.1 kg)   BMI 26.16 kg/m   Assessment and Plan: 1. Essential hypertension Fair control - continue current medication  2. Dyslipidemia On statin therapy  3. Generalized anxiety disorder Continue mirtazepine and xanax - ALPRAZolam (XANAX)  0.25 MG tablet; Take 1 tablet (0.25 mg total) by mouth 2 (two) times daily as needed. for anxiety  Dispense: 45 tablet; Refill: 5  4. Major depressive disorder with single episode, in partial remission (HCC) On zoloft and mirtazepine  5. Spondylosis of lumbar region without myelopathy or radiculopathy Begin mobic qAM in place of advil Continue tylenol as needed Continue ice or heat - meloxicam (MOBIC) 7.5 MG tablet; Take 1 tablet (7.5 mg total) by mouth daily.  Dispense: 30 tablet; Refill: 5  6. Chronic renal disease, stage III Improved last check Lab Results  Component Value Date   CREATININE 1.11 (H) 01/04/2016     Halina Maidens, MD [facility]  03/07/2016

## 2016-03-21 ENCOUNTER — Telehealth: Payer: Self-pay | Admitting: Internal Medicine

## 2016-03-21 ENCOUNTER — Other Ambulatory Visit: Payer: Self-pay | Admitting: Internal Medicine

## 2016-03-21 MED ORDER — AMLODIPINE BESYLATE 5 MG PO TABS: 5.0000 mg | ORAL_TABLET | Freq: Every day | ORAL | 0 refills | Status: DC

## 2016-03-21 NOTE — Telephone Encounter (Signed)
Patient to be seen Mon for elevated BP [id number] will add Amlodipine 5 mg and she must be seen to discuss the medication as this is 3 BP meds she is on. Patient was upset called 3 times I advised she is important and that is why we are working so late and she asked that I hurry so she can go to pharmacy. I advised her Mon at 85. [facility]

## 2016-03-26 ENCOUNTER — Encounter: Payer: Self-pay | Admitting: Internal Medicine

## 2016-03-27 ENCOUNTER — Ambulatory Visit (INDEPENDENT_AMBULATORY_CARE_PROVIDER_SITE_OTHER): Payer: Medicare Other | Admitting: Internal Medicine

## 2016-03-27 ENCOUNTER — Encounter: Payer: Self-pay | Admitting: Internal Medicine

## 2016-03-27 VITALS — BP 180/82 | HR 68 | Resp 16 | Ht 64.0 in | Wt 154.0 lb

## 2016-03-27 DIAGNOSIS — I10 Essential (primary) hypertension: Secondary | ICD-10-CM | POA: Diagnosis not present

## 2016-03-27 DIAGNOSIS — M47816 Spondylosis without myelopathy or radiculopathy, lumbar region: Secondary | ICD-10-CM

## 2016-03-27 DIAGNOSIS — N183 Chronic kidney disease, stage 3 unspecified: Secondary | ICD-10-CM

## 2016-03-27 MED ORDER — TRIAMTERENE-HCTZ 37.5-25 MG PO TABS: 1.0000 | ORAL_TABLET | Freq: Every day | ORAL | 5 refills | Status: DC

## 2016-03-27 NOTE — Patient Instructions (Addendum)
Goal BP is < [id number] systolic (top number)  Resume Maxzide daily  Total rest at the cabin in [place] for one week.

## 2016-03-27 NOTE — Progress Notes (Signed)
Date:  03/27/2016   Name:  Veronica Rowe   DOB:  05-28-1941   MRN:  [id number]   Chief Complaint: Hypertension Patient call last week because BP was elevated on 2 occasions.  She was already on losartan at max dose.  Maxzide was stopped last visit inadvertently. Due to her anxiety (without any worrisome sx) amlodipine was called in to begin 5 mg per day.   Hypertension  This is a chronic problem. The current episode started more than 1 year ago. The problem has been waxing and waning since onset. Pertinent negatives include no chest pain, headaches, palpitations or shortness of breath. Past treatments include angiotensin blockers, diuretics and calcium channel blockers. The current treatment provides moderate improvement. Hypertensive end-organ damage includes kidney disease.  Back Pain  This is a chronic problem. The problem is unchanged. The pain is present in the lumbar spine and thoracic spine. The quality of the pain is described as aching. The pain does not radiate. The symptoms are aggravated by stress and twisting. Pertinent negatives include no abdominal pain, chest pain, fever, headaches, numbness, paresthesias or weakness. She has tried chiropractic manipulation and NSAIDs (spine specialist wanted to do surgery which she declined - does not want another evaluation at this time.  Mobic is not helping.) for the symptoms.    Lab Results  Component Value Date   CREATININE 1.11 (H) 01/04/2016      Review of Systems  Constitutional: Negative for chills, fatigue and fever.  Respiratory: Negative for cough, chest tightness, shortness of breath, wheezing and stridor.   Cardiovascular: Negative for chest pain, palpitations and leg swelling.  Gastrointestinal: Negative for abdominal pain.  Genitourinary: Positive for hematuria (over the weekend - thinks she passed a stone).  Musculoskeletal: Positive for back pain. Negative for arthralgias.  Neurological: Negative for dizziness,  syncope, weakness, light-headedness, numbness, headaches and paresthesias.  Psychiatric/Behavioral: Negative for sleep disturbance.    Patient Active Problem List   Diagnosis Date Noted  . Vitamin D deficiency 08/17/2015  . Essential hypertension 07/07/2015  . Major depressive disorder with single episode, in partial remission ([place]) 07/07/2015  . Chronic renal disease, stage III 06/26/2015  . Hx of peptic ulcer 06/26/2015  . Generalized psoriasis 06/26/2015  . Stress incontinence, female 06/26/2015  . Dyslipidemia 06/26/2015  . Generalized anxiety disorder 06/26/2015  . Atypical hyperplasia of breast 11/12/2013  . Microcalcifications of the breast     Prior to Admission medications   Medication Sig Start Date End Date Taking? Authorizing Provider  ALPRAZolam (XANAX) 0.25 MG tablet Take 1 tablet (0.25 mg total) by mouth 2 (two) times daily as needed. for anxiety 03/07/16  Yes Glean Hess, MD  amLODipine (NORVASC) 5 MG tablet Take 1 tablet (5 mg total) by mouth daily. 03/21/16  Yes Glean Hess, MD  cholecalciferol (VITAMIN D) 1000 UNITS tablet Take 1,000 Units by mouth daily.   Yes Historical Provider, MD  clotrimazole-betamethasone (LOTRISONE) cream Apply 1 application topically as needed. 11/03/13  Yes Historical Provider, MD  fluticasone (FLONASE) 50 MCG/ACT nasal spray Place 2 sprays into both nostrils daily. 08/11/15  Yes Historical Provider, MD  losartan (COZAAR) 100 MG tablet Take 1 tablet (100 mg total) by mouth daily. 01/04/16  Yes Glean Hess, MD  meloxicam (MOBIC) 7.5 MG tablet Take 1 tablet (7.5 mg total) by mouth daily. 03/07/16  Yes Glean Hess, MD  mirtazapine (REMERON) 15 MG tablet 7.5 mg. Taking 1/2 Only 10/13/15  Yes Historical Provider, MD  omeprazole (PRILOSEC) 20 MG capsule Take 1 capsule (20 mg total) by mouth 2 (two) times daily. 02/02/16  Yes Glean Hess, MD  sertraline (ZOLOFT) 50 MG tablet Take 1 tablet (50 mg total) by mouth daily. 12/13/15  Yes  Glean Hess, MD  simvastatin (ZOCOR) 20 MG tablet Take 1 tablet (20 mg total) by mouth every evening. 12/13/15  Yes Glean Hess, MD  triamterene-hydrochlorothiazide (MAXZIDE-25) 37.5-25 MG tablet Take 1 tablet by mouth daily. 12/13/15  Yes Glean Hess, MD    Allergies  Allergen Reactions  . Codeine Itching and Other (See Comments)    hallucinations    Past Surgical History:  Procedure Laterality Date  . BREAST BIOPSY  2013   The patient's Baker Janus model risk for breast cancer is 1.4% for the next 5 years, 4.4% lifetime    Social History  Substance Use Topics  . Smoking status: Never Smoker  . Smokeless tobacco: Never Used  . Alcohol use No     Medication list has been reviewed and updated.   Physical Exam  Constitutional: She is oriented to person, place, and time. She appears well-developed. No distress.  HENT:  Head: Normocephalic and atraumatic.  Neck: Normal range of motion. Neck supple. No JVD present. Carotid bruit is not present. No thyromegaly present.  Cardiovascular: Normal rate, regular rhythm and normal heart sounds.  Exam reveals no gallop.   No murmur heard. Pulses:      Radial pulses are 2+ on the right side, and 2+ on the left side.  Pulmonary/Chest: Effort normal and breath sounds normal. No respiratory distress. She has no decreased breath sounds. She has no wheezes.  Musculoskeletal: She exhibits no edema or tenderness.  Neurological: She is alert and oriented to person, place, and time.  Skin: Skin is warm and dry. No rash noted.  Psychiatric: She has a normal mood and affect. Her behavior is normal. Thought content normal.  Nursing note and vitals reviewed.   BP (!) 154/84 (BP Location: Left Arm)   Pulse 68   Resp 16   Ht 5\' 4"  (1.626 m)   Wt 154 lb (69.9 kg)   SpO2 100%   BMI 26.43 kg/m   Assessment and Plan: 1. Essential hypertension Isolated systolic HTN - resume Maxzide; continue cozaar and norvasc F/u 2 months -  triamterene-hydrochlorothiazide (MAXZIDE-25) 37.5-25 MG tablet; Take 1 tablet by mouth daily.  Dispense: 30 tablet; Refill: 5  2. Chronic renal disease, stage III stable  3. Spondylosis of lumbar region without myelopathy or radiculopathy Stop mobic due to lack of benefit Continue AS Tylenol; chiropractic care Patient declines referral to Spine specialist   Halina Maidens, MD [facility]  03/27/2016

## 2016-04-12 ENCOUNTER — Other Ambulatory Visit: Payer: Self-pay | Admitting: Internal Medicine

## 2016-04-12 DIAGNOSIS — F411 Generalized anxiety disorder: Secondary | ICD-10-CM

## 2016-04-19 ENCOUNTER — Other Ambulatory Visit: Payer: Self-pay | Admitting: Internal Medicine

## 2016-04-19 DIAGNOSIS — F324 Major depressive disorder, single episode, in partial remission: Secondary | ICD-10-CM

## 2016-05-10 ENCOUNTER — Other Ambulatory Visit: Payer: Self-pay | Admitting: Internal Medicine

## 2016-05-17 ENCOUNTER — Other Ambulatory Visit: Payer: Self-pay | Admitting: Internal Medicine

## 2016-05-17 DIAGNOSIS — F411 Generalized anxiety disorder: Secondary | ICD-10-CM

## 2016-05-26 ENCOUNTER — Other Ambulatory Visit: Payer: Self-pay

## 2016-05-26 DIAGNOSIS — F411 Generalized anxiety disorder: Secondary | ICD-10-CM

## 2016-05-26 MED ORDER — ALPRAZOLAM 0.25 MG PO TABS: 0.2500 mg | ORAL_TABLET | Freq: Two times a day (BID) | ORAL | 0 refills | Status: DC | PRN

## 2016-05-30 ENCOUNTER — Ambulatory Visit: Payer: Medicare Other | Admitting: Internal Medicine

## 2016-06-06 ENCOUNTER — Other Ambulatory Visit: Payer: Self-pay | Admitting: Internal Medicine

## 2016-06-06 DIAGNOSIS — I10 Essential (primary) hypertension: Secondary | ICD-10-CM

## 2016-06-13 ENCOUNTER — Other Ambulatory Visit: Payer: Self-pay | Admitting: Internal Medicine

## 2016-06-27 ENCOUNTER — Encounter: Payer: Self-pay | Admitting: Internal Medicine

## 2016-06-27 ENCOUNTER — Ambulatory Visit (INDEPENDENT_AMBULATORY_CARE_PROVIDER_SITE_OTHER): Payer: Medicare Other | Admitting: Internal Medicine

## 2016-06-27 VITALS — BP 117/76 | HR 71 | Resp 16 | Ht 64.0 in | Wt 154.2 lb

## 2016-06-27 DIAGNOSIS — N183 Chronic kidney disease, stage 3 unspecified: Secondary | ICD-10-CM

## 2016-06-27 DIAGNOSIS — F411 Generalized anxiety disorder: Secondary | ICD-10-CM | POA: Diagnosis not present

## 2016-06-27 DIAGNOSIS — M47816 Spondylosis without myelopathy or radiculopathy, lumbar region: Secondary | ICD-10-CM | POA: Diagnosis not present

## 2016-06-27 DIAGNOSIS — I10 Essential (primary) hypertension: Secondary | ICD-10-CM | POA: Diagnosis not present

## 2016-06-27 MED ORDER — ALPRAZOLAM 0.25 MG PO TABS: 0.2500 mg | ORAL_TABLET | Freq: Two times a day (BID) | ORAL | 5 refills | Status: DC | PRN

## 2016-06-27 MED ORDER — TRAMADOL HCL 50 MG PO TABS: 50.0000 mg | ORAL_TABLET | Freq: Four times a day (QID) | ORAL | 5 refills | Status: DC | PRN

## 2016-06-27 NOTE — Progress Notes (Signed)
Date:  06/27/2016   Name:  Veronica Rowe   DOB:  1941-04-12   MRN:  [id number]   Chief Complaint: Hypertension Hypertension  This is a chronic problem. The current episode started more than 1 year ago. The problem has been gradually improving since onset. The problem is controlled. Pertinent negatives include no chest pain, headaches, palpitations or shortness of breath. Past treatments include diuretics and calcium channel blockers.  Back Pain  This is a chronic problem. The problem occurs daily. Pertinent negatives include no abdominal pain, chest pain, dysuria, fever, headaches or numbness. She has tried NSAIDs, chiropractic manipulation and analgesics (taking Aleve 4 tabs twice a day) for the symptoms. The treatment provided mild relief.  Renal Insufficiency - taking 8 Aleve per day for back pain.  Previously taking Tramadol and then Hydrocodone but I declined to continue the narcotic Rx.   Review of Systems  Constitutional: Negative for appetite change, fatigue, fever and unexpected weight change.  HENT: Negative for tinnitus and trouble swallowing.   Eyes: Negative for visual disturbance.  Respiratory: Negative for cough, chest tightness and shortness of breath.   Cardiovascular: Negative for chest pain, palpitations and leg swelling.  Gastrointestinal: Negative for abdominal pain.  Endocrine: Negative for polydipsia and polyuria.  Genitourinary: Negative for dysuria and hematuria.  Musculoskeletal: Positive for back pain. Negative for arthralgias.  Neurological: Negative for tremors, numbness and headaches.  Psychiatric/Behavioral: Positive for sleep disturbance. Negative for dysphoric mood. The patient is nervous/anxious.     Patient Active Problem List   Diagnosis Date Noted  . Lumbar spondylosis 03/27/2016  . Vitamin D deficiency 08/17/2015  . Essential hypertension 07/07/2015  . Major depressive disorder with single episode, in partial remission ([place]) 07/07/2015  .  Chronic renal disease, stage III 06/26/2015  . Hx of peptic ulcer 06/26/2015  . Generalized psoriasis 06/26/2015  . Stress incontinence, female 06/26/2015  . Dyslipidemia 06/26/2015  . Generalized anxiety disorder 06/26/2015  . Atypical hyperplasia of breast 11/12/2013  . Microcalcifications of the breast     Prior to Admission medications   Medication Sig Start Date End Date Taking? Authorizing Provider  ALPRAZolam (XANAX) 0.25 MG tablet Take 1 tablet (0.25 mg total) by mouth 2 (two) times daily as needed. for anxiety 05/26/16  Yes Glean Hess, MD      Yes Glean Hess, MD  cholecalciferol (VITAMIN D) 1000 UNITS tablet Take 1,000 Units by mouth daily.   Yes Historical Provider, MD  clotrimazole-betamethasone (LOTRISONE) cream Apply 1 application topically as needed. 11/03/13  Yes Historical Provider, MD  fluticasone (FLONASE) 50 MCG/ACT nasal spray Place 2 sprays into both nostrils daily. 08/11/15  Yes Historical Provider, MD  losartan (COZAAR) 100 MG tablet TAKE ONE TABLET BY MOUTH EVERY DAY 06/06/16  Yes Glean Hess, MD  mirtazapine (REMERON) 15 MG tablet TAKE ONE TABLET BY MOUTH DAILY AT BEDTIME 04/19/16  Yes Glean Hess, MD  omeprazole (PRILOSEC) 20 MG capsule Take 1 capsule (20 mg total) by mouth 2 (two) times daily. 02/02/16  Yes Glean Hess, MD  sertraline (ZOLOFT) 50 MG tablet Take 1 tablet (50 mg) by mouth daily. 05/10/16  Yes Glean Hess, MD  simvastatin (ZOCOR) 20 MG tablet Take 1 tablet (20 mg) by mouth every evening. 06/13/16  Yes Glean Hess, MD  triamterene-hydrochlorothiazide (MAXZIDE-25) 37.5-25 MG tablet Take 1 tablet by mouth daily. 03/27/16  Yes Glean Hess, MD    Allergies  Allergen Reactions  . Codeine Itching  and Other (See Comments)    hallucinations    Past Surgical History:  Procedure Laterality Date  . BREAST BIOPSY  2013   The patient's Baker Janus model risk for breast cancer is 1.4% for the next 5 years, 4.4% lifetime     Social History  Substance Use Topics  . Smoking status: Never Smoker  . Smokeless tobacco: Never Used  . Alcohol use No     Medication list has been reviewed and updated.   Physical Exam  Constitutional: She is oriented to person, place, and time. She appears well-developed. No distress.  HENT:  Head: Normocephalic and atraumatic.  Cardiovascular: Normal rate, regular rhythm and normal heart sounds.   Pulmonary/Chest: Effort normal and breath sounds normal. No respiratory distress.  Musculoskeletal: Normal range of motion.  Neurological: She is alert and oriented to person, place, and time.  Skin: Skin is warm and dry. No rash noted.  Psychiatric: She has a normal mood and affect. Her behavior is normal. Thought content normal.  Nursing note and vitals reviewed.   BP 117/76   Pulse 71   Resp 16   Ht 5\' 4"  (1.626 m)   Wt 154 lb 3.2 oz (69.9 kg)   SpO2 100%   BMI 26.47 kg/m   Assessment and Plan: 1. Essential hypertension Improved control - continue Losartan and HCTZ  2. Chronic renal disease, stage III Stop all nsaids and monitor closely - Basic metabolic panel  3. Generalized anxiety disorder - ALPRAZolam (XANAX) 0.25 MG tablet; Take 1 tablet (0.25 mg total) by mouth 2 (two) times daily as needed. for anxiety  Dispense: 45 tablet; Refill: 5  4. Lumbar spondylosis Change regimen to Tramadol 50 mg qid prn along with tylenol 350 mg qid prn - traMADol (ULTRAM) 50 MG tablet; Take 1 tablet (50 mg total) by mouth every 6 (six) hours as needed.  Dispense: 120 tablet; Refill: [place], MD [facility]  06/27/2016

## 2016-06-27 NOTE — Patient Instructions (Signed)
Take tylenol 325 mg with each Tramadol dose.

## 2016-06-28 LAB — BASIC METABOLIC PANEL
BUN/Creatinine Ratio: 21 (ref 12–28)
BUN: 34 mg/dL — AB (ref 8–27)
CO2: 26 mmol/L (ref 18–29)
CREATININE: 1.63 mg/dL — AB (ref 0.57–1.00)
Calcium: 9.8 mg/dL (ref 8.7–10.3)
Chloride: 96 mmol/L (ref 96–106)
GFR calc Af Amer: 36 mL/min/{1.73_m2} — ABNORMAL LOW (ref >59)
GFR, EST NON AFRICAN AMERICAN: 31 mL/min/{1.73_m2} — AB (ref >59)
Glucose: 83 mg/dL (ref 65–99)
Potassium: 4.8 mmol/L (ref 3.5–5.2)
SODIUM: 142 mmol/L (ref 134–144)

## 2016-06-28 NOTE — Addendum Note (Signed)
Addended by: Glean Hess on: 06/28/2016 01:35 PM   Modules accepted: Orders

## 2016-07-19 ENCOUNTER — Other Ambulatory Visit: Payer: Self-pay | Admitting: Internal Medicine

## 2016-08-02 ENCOUNTER — Other Ambulatory Visit: Payer: Self-pay | Admitting: Internal Medicine

## 2016-08-02 DIAGNOSIS — I10 Essential (primary) hypertension: Secondary | ICD-10-CM

## 2016-09-07 ENCOUNTER — Ambulatory Visit: Payer: BC Managed Care – PPO | Admitting: Internal Medicine

## 2016-10-25 ENCOUNTER — Other Ambulatory Visit: Payer: Self-pay | Admitting: Internal Medicine

## 2016-11-16 ENCOUNTER — Other Ambulatory Visit: Payer: Self-pay | Admitting: Internal Medicine

## 2017-01-11 ENCOUNTER — Other Ambulatory Visit: Payer: Self-pay | Admitting: Internal Medicine

## 2017-01-11 DIAGNOSIS — F411 Generalized anxiety disorder: Secondary | ICD-10-CM

## 2017-01-16 ENCOUNTER — Ambulatory Visit (INDEPENDENT_AMBULATORY_CARE_PROVIDER_SITE_OTHER): Payer: Medicare Other | Admitting: Internal Medicine

## 2017-01-16 ENCOUNTER — Encounter: Payer: Self-pay | Admitting: Internal Medicine

## 2017-01-16 VITALS — BP 118/60 | HR 67 | Ht 64.0 in | Wt 142.0 lb

## 2017-01-16 DIAGNOSIS — Z0000 Encounter for general adult medical examination without abnormal findings: Secondary | ICD-10-CM | POA: Diagnosis not present

## 2017-01-16 DIAGNOSIS — N183 Chronic kidney disease, stage 3 unspecified: Secondary | ICD-10-CM

## 2017-01-16 DIAGNOSIS — I10 Essential (primary) hypertension: Secondary | ICD-10-CM | POA: Diagnosis not present

## 2017-01-16 DIAGNOSIS — E785 Hyperlipidemia, unspecified: Secondary | ICD-10-CM | POA: Diagnosis not present

## 2017-01-16 DIAGNOSIS — Z1231 Encounter for screening mammogram for malignant neoplasm of breast: Secondary | ICD-10-CM | POA: Diagnosis not present

## 2017-01-16 DIAGNOSIS — F324 Major depressive disorder, single episode, in partial remission: Secondary | ICD-10-CM | POA: Diagnosis not present

## 2017-01-16 LAB — POCT URINALYSIS DIPSTICK
Bilirubin, UA: NEGATIVE
Blood, UA: NEGATIVE
GLUCOSE UA: NEGATIVE
Ketones, UA: NEGATIVE
NITRITE UA: NEGATIVE
PROTEIN UA: NEGATIVE
Spec Grav, UA: <=1.005 — AB (ref 1.010–1.025)
Urobilinogen, UA: 0.2 E.U./dL
pH, UA: 7 (ref 5.0–8.0)

## 2017-01-16 NOTE — Progress Notes (Signed)
Patient: Veronica Rowe, Female    DOB: 03-25-1941, 76 y.o.   MRN: [id number] Visit Date: 01/16/2017  Today's Provider: Halina Maidens, MD   Chief Complaint  Patient presents with  . Medicare Wellness    Breast Exam.  . Hypertension  . Hyperlipidemia  . Depression   Subjective:    Annual wellness visit Veronica Rowe is a 76 y.o. female who presents today for her Subsequent Annual Wellness Visit. She feels fairly well. She reports exercising none. She reports she is sleeping poorly - gets 3 hours sleep with mirtazepine.   ----------------------------------------------------------- Hypertension  This is a chronic problem. The problem is unchanged. The problem is controlled. Pertinent negatives include no chest pain, headaches, palpitations or shortness of breath.  Hyperlipidemia  This is a chronic problem. The problem is controlled. Pertinent negatives include no chest pain or shortness of breath. Current antihyperlipidemic treatment includes statins. The current treatment provides significant improvement of lipids.  Depression         This is a chronic problem.The problem is unchanged.  Associated symptoms include fatigue, insomnia, decreased interest and appetite change.  Associated symptoms include no headaches and no suicidal ideas.  Past treatments include SSRIs - Selective serotonin reuptake inhibitors.  Compliance with treatment is good.   Review of Systems  Constitutional: Positive for appetite change and fatigue. Negative for chills and fever.  HENT: Negative for congestion, hearing loss, tinnitus, trouble swallowing and voice change.   Eyes: Negative for visual disturbance.  Respiratory: Negative for cough, chest tightness, shortness of breath and wheezing.   Cardiovascular: Negative for chest pain, palpitations and leg swelling.  Gastrointestinal: Negative for abdominal pain, constipation, diarrhea and vomiting.  Endocrine: Negative for polydipsia and polyuria.    Genitourinary: Negative for dysuria, frequency, genital sores, vaginal bleeding and vaginal discharge.  Musculoskeletal: Negative for arthralgias, gait problem and joint swelling.  Skin: Negative for color change and rash.  Neurological: Negative for dizziness, tremors, light-headedness and headaches.  Hematological: Negative for adenopathy. Does not bruise/bleed easily.  Psychiatric/Behavioral: Positive for depression, dysphoric mood and sleep disturbance. Negative for suicidal ideas. The patient has insomnia. The patient is not nervous/anxious.     Social History   Social History  . Marital status: Married    Spouse name: N/A  . Number of children: N/A  . Years of education: N/A   Occupational History  . Not on file.   Social History Main Topics  . Smoking status: Never Smoker  . Smokeless tobacco: Never Used  . Alcohol use No  . Drug use: No  . Sexual activity: Not on file   Other Topics Concern  . Not on file   Social History Narrative  . No narrative on file    Patient Active Problem List   Diagnosis Date Noted  . Lumbar spondylosis 03/27/2016  . Vitamin D deficiency 08/17/2015  . Essential hypertension 07/07/2015  . Major depressive disorder with single episode, in partial remission ([place]) 07/07/2015  . Chronic renal disease, stage III 06/26/2015  . Hx of peptic ulcer 06/26/2015  . Generalized psoriasis 06/26/2015  . Stress incontinence, female 06/26/2015  . Dyslipidemia 06/26/2015  . Generalized anxiety disorder 06/26/2015  . Atypical hyperplasia of breast 11/12/2013  . Microcalcifications of the breast     Past Surgical History:  Procedure Laterality Date  . BREAST BIOPSY  2013   The patient's Baker Janus model risk for breast cancer is 1.4% for the next 5 years, 4.4% lifetime    Her family  history includes Cirrhosis in her paternal uncle; Lung cancer in her father; Prostate cancer in her maternal grandfather.     Previous Medications   ACETAMINOPHEN  (TYLENOL) 325 MG TABLET    Take 650 mg by mouth every 6 (six) hours as needed.   ALPRAZOLAM (XANAX) 0.25 MG TABLET    Take 1 tablet (0.25 mg total) by mouth 2 (two) times daily as needed. for anxiety   CHOLECALCIFEROL (VITAMIN D) 1000 UNITS TABLET    Take 1,000 Units by mouth daily.   FLUTICASONE (FLONASE) 50 MCG/ACT NASAL SPRAY    Place 2 sprays into both nostrils daily.   LOSARTAN (COZAAR) 100 MG TABLET    TAKE ONE TABLET BY MOUTH EVERY DAY   MIRTAZAPINE (REMERON) 15 MG TABLET    TAKE ONE TABLET BY MOUTH DAILY AT BEDTIME   OMEPRAZOLE (PRILOSEC) 20 MG CAPSULE    TAKE ONE CAPSULE BY MOUTH TWICE DAILY   SERTRALINE (ZOLOFT) 50 MG TABLET    TAKE ONE TABLET BY MOUTH EVERY DAY   SIMVASTATIN (ZOCOR) 20 MG TABLET    TAKE ONE TABLET BY MOUTH EVERY EVENING   TRAMADOL (ULTRAM) 50 MG TABLET    Take 1 tablet (50 mg total) by mouth every 6 (six) hours as needed.   TRIAMTERENE-HYDROCHLOROTHIAZIDE (RSWNIOE-70) 37.5-25 MG TABLET    TAKE ONE TABLET BY MOUTH EVERY DAY    Patient Care Team: Glean Hess, MD as PCP - General (Internal Medicine) Bary Castilla, Forest Gleason, MD (General Surgery)      Objective:   Vitals: BP 118/60 (BP Location: Right Arm, Patient Position: Sitting, Cuff Size: Normal)   Pulse 67   Ht 5\' 4"  (1.626 m)   Wt 142 lb (64.4 kg)   SpO2 99%   BMI 24.37 kg/m   Physical Exam  Constitutional: She is oriented to person, place, and time. She appears well-developed and well-nourished. No distress.  HENT:  Head: Normocephalic and atraumatic.  Right Ear: Tympanic membrane and ear canal normal.  Left Ear: Tympanic membrane and ear canal normal.  Nose: Right sinus exhibits no maxillary sinus tenderness. Left sinus exhibits no maxillary sinus tenderness.  Mouth/Throat: Uvula is midline and oropharynx is clear and moist.  Eyes: Conjunctivae and EOM are normal. Right eye exhibits no discharge. Left eye exhibits no discharge. No scleral icterus.  Neck: Normal range of motion. Carotid bruit is  not present. No erythema present. No thyromegaly present.  Cardiovascular: Normal rate, regular rhythm, normal heart sounds and normal pulses.   Pulmonary/Chest: Effort normal. No respiratory distress. She has no wheezes. Right breast exhibits no mass, no nipple discharge, no skin change and no tenderness. Left breast exhibits no mass, no nipple discharge, no skin change and no tenderness.  Abdominal: Soft. Bowel sounds are normal. There is no hepatosplenomegaly. There is no tenderness. There is no CVA tenderness.  Musculoskeletal: Normal range of motion.  Lymphadenopathy:    She has no cervical adenopathy.    She has no axillary adenopathy.  Neurological: She is alert and oriented to person, place, and time. She has normal reflexes. No cranial nerve deficit or sensory deficit.  Skin: Skin is warm, dry and intact. No rash noted.  Psychiatric: She has a normal mood and affect. Her speech is normal and behavior is normal. Thought content normal.  Nursing note and vitals reviewed.   Activities of Daily Living In your present state of health, do you have any difficulty performing the following activities: 01/16/2017 01/16/2017  Hearing? N N  Vision?  N N  Difficulty concentrating or making decisions? N N  Walking or climbing stairs? N N  Dressing or bathing? N N  Doing errands, shopping? N N  Preparing Food and eating ? N -  Using the Toilet? N -  In the past six months, have you accidently leaked urine? N -  Do you have problems with loss of bowel control? N -  Managing your Medications? N -  Managing your Finances? N -  Housekeeping or managing your Housekeeping? N -  Some recent data might be hidden    Fall Risk Assessment Fall Risk  01/16/2017 01/04/2016 08/17/2015  Falls in the past year? Yes No No  Number falls in past yr: 2 or more - -  Injury with Fall? No - -  Risk Factor Category  High Fall Risk - -      Depression Screen PHQ 2/9 Scores 01/16/2017 01/16/2017 01/04/2016 08/17/2015  PHQ  - 2 Score 2 1 4  0  PHQ- 9 Score 11 10 14  -   6CIT Screen 01/16/2017  What Year? 0 points  What month? 0 points  What time? 0 points  Count back from 20 0 points  Months in reverse 0 points  Repeat phrase 0 points  Total Score 0    Medicare Annual Wellness Visit Summary:  Reviewed patient's Family Medical History Reviewed and updated list of patient's medical providers Assessment of cognitive impairment was done Assessed patient's functional ability Established a written schedule for health screening [place] Completed and Reviewed  Exercise Activities and Dietary recommendations Goals    None      Immunization History  Administered Date(s) Administered  . Pneumococcal Conjugate-13 01/04/2016  . Pneumococcal Polysaccharide-23 10/05/2011  . Tdap 10/28/2013    Health Maintenance  Topic Date Due  . COLONOSCOPY  09/13/2015  . INFLUENZA VACCINE  03/14/2017  . MAMMOGRAM  01/19/2018  . TETANUS/TDAP  10/29/2023  . DEXA SCAN  Completed  . PNA vac Low Risk Adult  Completed    Discussed health benefits of physical activity, and encouraged her to engage in regular exercise appropriate for her age and condition.    ------------------------------------------------------------------------------------------------------------  Assessment & Plan:  1. Medicare annual wellness visit, subsequent Measures satisfied Aged out of colonoscopy Falls related to helping elderly husband and mother who often lose their balance and fall against her - POCT urinalysis dipstick  2. Essential hypertension controlled - CBC with Differential/Platelet - Comprehensive metabolic panel - TSH  3. Chronic renal disease, stage III Followed by Nephrology  4. Dyslipidemia Continue simvastatin - Lipid panel  5. Major depressive disorder with single episode, in partial remission ([facility]) Liberalize diet Continue medications  6. Encounter for screening mammogram for breast  cancer - MM DIGITAL SCREENING BILATERAL; Future   No orders of the defined types were placed in this encounter.   Halina Maidens, MD [facility]  01/16/2017

## 2017-01-16 NOTE — Patient Instructions (Signed)
Health Maintenance  Topic Date Due  . COLONOSCOPY  09/13/2015  . INFLUENZA VACCINE  03/14/2017  . MAMMOGRAM  01/19/2018  . TETANUS/TDAP  10/29/2023  . DEXA SCAN  Completed  . PNA vac Low Risk Adult  Completed    Breast Self-Awareness Breast self-awareness means being familiar with how your breasts look and feel. It involves checking your breasts regularly and reporting any changes to your health care provider. Practicing breast self-awareness is important. A change in your breasts can be a sign of a serious medical problem. Being familiar with how your breasts look and feel allows you to find any problems early, when treatment is more likely to be successful. All women should practice breast self-awareness, including women who have had breast implants. How to do a breast self-exam One way to learn what is normal for your breasts and whether your breasts are changing is to do a breast self-exam. To do a breast self-exam: Look for Changes  1. Remove all the clothing above your waist. 2. Stand in front of a mirror in a room with good lighting. 3. Put your hands on your hips. 4. Push your hands firmly downward. 5. Compare your breasts in the mirror. Look for differences between them (asymmetry), such as: ? Differences in shape. ? Differences in size. ? Puckers, dips, and bumps in one breast and not the other. 6. Look at each breast for changes in your skin, such as: ? Redness. ? Scaly areas. 7. Look for changes in your nipples, such as: ? Discharge. ? Bleeding. ? Dimpling. ? Redness. ? A change in position. Feel for Changes  Carefully feel your breasts for lumps and changes. It is best to do this while lying on your back on the floor and again while sitting or standing in the shower or tub with soapy water on your skin. Feel each breast in the following way:  Place the arm on the side of the breast you are examining above your head.  Feel your breast with the other hand.  Start  in the nipple area and make  inch (2 cm) overlapping circles to feel your breast. Use the pads of your three middle fingers to do this. Apply light pressure, then medium pressure, then firm pressure. The light pressure will allow you to feel the tissue closest to the skin. The medium pressure will allow you to feel the tissue that is a little deeper. The firm pressure will allow you to feel the tissue close to the ribs.  Continue the overlapping circles, moving downward over the breast until you feel your ribs below your breast.  Move one finger-width toward the center of the body. Continue to use the  inch (2 cm) overlapping circles to feel your breast as you move slowly up toward your collarbone.  Continue the up and down exam using all three pressures until you reach your armpit.  Write Down What You Find  Write down what is normal for each breast and any changes that you find. Keep a written record with breast changes or normal findings for each breast. By writing this information down, you do not need to depend only on memory for size, tenderness, or location. Write down where you are in your menstrual cycle, if you are still menstruating. If you are having trouble noticing differences in your breasts, do not get discouraged. With time you will become more familiar with the variations in your breasts and more comfortable with the exam. How often should  I examine my breasts? Examine your breasts every month. If you are breastfeeding, the best time to examine your breasts is after a feeding or after using a breast pump. If you menstruate, the best time to examine your breasts is 5-7 days after your period is over. During your period, your breasts are lumpier, and it may be more difficult to notice changes. When should I see my health care provider? See your health care provider if you notice:  A change in shape or size of your breasts or nipples.  A change in the skin of your breast or  nipples, such as a reddened or scaly area.  Unusual discharge from your nipples.  A lump or thick area that was not there before.  Pain in your breasts.  Anything that concerns you.  This information is not intended to replace advice given to you by your health care provider. Make sure you discuss any questions you have with your health care provider. Document Released: 07/31/2005 Document Revised: 01/06/2016 Document Reviewed: 06/20/2015 Elsevier Interactive Patient Education  [employer].

## 2017-01-17 ENCOUNTER — Other Ambulatory Visit: Payer: Self-pay | Admitting: Internal Medicine

## 2017-01-17 DIAGNOSIS — F411 Generalized anxiety disorder: Secondary | ICD-10-CM

## 2017-01-17 LAB — LIPID PANEL
CHOL/HDL RATIO: 2.8 ratio (ref 0.0–4.4)
CHOLESTEROL TOTAL: 156 mg/dL (ref 100–199)
HDL: 56 mg/dL (ref >39)
LDL CALC: 79 mg/dL (ref 0–99)
Triglycerides: 103 mg/dL (ref 0–149)
VLDL CHOLESTEROL CAL: 21 mg/dL (ref 5–40)

## 2017-01-17 LAB — CBC WITH DIFFERENTIAL/PLATELET
BASOS ABS: 0 10*3/uL (ref 0.0–0.2)
Basos: 0 %
EOS (ABSOLUTE): 0.2 10*3/uL (ref 0.0–0.4)
Eos: 3 %
HEMATOCRIT: 39.5 % (ref 34.0–46.6)
HEMOGLOBIN: 13.4 g/dL (ref 11.1–15.9)
IMMATURE GRANS (ABS): 0 10*3/uL (ref 0.0–0.1)
IMMATURE GRANULOCYTES: 0 %
LYMPHS ABS: 1.7 10*3/uL (ref 0.7–3.1)
LYMPHS: 23 %
MCH: 29.6 pg (ref 26.6–33.0)
MCHC: 33.9 g/dL (ref 31.5–35.7)
MCV: 87 fL (ref 79–97)
MONOCYTES: 8 %
Monocytes Absolute: 0.6 10*3/uL (ref 0.1–0.9)
NEUTROS PCT: 66 %
Neutrophils Absolute: 4.9 10*3/uL (ref 1.4–7.0)
Platelets: 267 10*3/uL (ref 150–379)
RBC: 4.53 x10E6/uL (ref 3.77–5.28)
RDW: 13.9 % (ref 12.3–15.4)
WBC: 7.5 10*3/uL (ref 3.4–10.8)

## 2017-01-17 LAB — COMPREHENSIVE METABOLIC PANEL
ALBUMIN: 4.6 g/dL (ref 3.5–4.8)
ALK PHOS: 82 IU/L (ref 39–117)
ALT: 11 IU/L (ref 0–32)
AST: 16 IU/L (ref 0–40)
Albumin/Globulin Ratio: 1.6 (ref 1.2–2.2)
BUN / CREAT RATIO: 18 (ref 12–28)
BUN: 27 mg/dL (ref 8–27)
Bilirubin Total: 0.7 mg/dL (ref 0.0–1.2)
CALCIUM: 9.8 mg/dL (ref 8.7–10.3)
CO2: 30 mmol/L — AB (ref 18–29)
CREATININE: 1.5 mg/dL — AB (ref 0.57–1.00)
Chloride: 97 mmol/L (ref 96–106)
GFR calc Af Amer: 40 mL/min/{1.73_m2} — ABNORMAL LOW (ref >59)
GFR, EST NON AFRICAN AMERICAN: 34 mL/min/{1.73_m2} — AB (ref >59)
GLUCOSE: 87 mg/dL (ref 65–99)
Globulin, Total: 2.8 g/dL (ref 1.5–4.5)
Potassium: 4.1 mmol/L (ref 3.5–5.2)
Sodium: 142 mmol/L (ref 134–144)
Total Protein: 7.4 g/dL (ref 6.0–8.5)

## 2017-01-17 LAB — TSH: TSH: 0.672 u[IU]/mL (ref 0.450–4.500)

## 2017-01-31 ENCOUNTER — Other Ambulatory Visit: Payer: Self-pay | Admitting: Internal Medicine

## 2017-01-31 DIAGNOSIS — M47816 Spondylosis without myelopathy or radiculopathy, lumbar region: Secondary | ICD-10-CM

## 2017-02-28 ENCOUNTER — Other Ambulatory Visit: Payer: Self-pay | Admitting: Internal Medicine

## 2017-05-08 ENCOUNTER — Other Ambulatory Visit: Payer: Self-pay | Admitting: Internal Medicine

## 2017-05-08 DIAGNOSIS — F324 Major depressive disorder, single episode, in partial remission: Secondary | ICD-10-CM

## 2017-06-13 ENCOUNTER — Other Ambulatory Visit: Payer: Self-pay | Admitting: Internal Medicine

## 2017-06-22 ENCOUNTER — Other Ambulatory Visit: Payer: Self-pay | Admitting: Internal Medicine

## 2017-07-09 ENCOUNTER — Other Ambulatory Visit: Payer: Self-pay | Admitting: Internal Medicine

## 2017-07-09 DIAGNOSIS — I10 Essential (primary) hypertension: Secondary | ICD-10-CM

## 2017-07-19 ENCOUNTER — Other Ambulatory Visit: Payer: Self-pay | Admitting: Internal Medicine

## 2017-07-19 DIAGNOSIS — F411 Generalized anxiety disorder: Secondary | ICD-10-CM

## 2017-07-24 ENCOUNTER — Other Ambulatory Visit: Payer: Self-pay | Admitting: Internal Medicine

## 2017-07-24 ENCOUNTER — Ambulatory Visit: Payer: Medicare Other | Admitting: Internal Medicine

## 2017-08-02 ENCOUNTER — Ambulatory Visit (INDEPENDENT_AMBULATORY_CARE_PROVIDER_SITE_OTHER): Payer: Medicare Other | Admitting: Internal Medicine

## 2017-08-02 ENCOUNTER — Encounter: Payer: Self-pay | Admitting: Internal Medicine

## 2017-08-02 VITALS — BP 140/64 | HR 61 | Ht 64.0 in | Wt 141.0 lb

## 2017-08-02 DIAGNOSIS — N183 Chronic kidney disease, stage 3 unspecified: Secondary | ICD-10-CM

## 2017-08-02 DIAGNOSIS — R634 Abnormal weight loss: Secondary | ICD-10-CM | POA: Diagnosis not present

## 2017-08-02 DIAGNOSIS — I10 Essential (primary) hypertension: Secondary | ICD-10-CM

## 2017-08-02 DIAGNOSIS — F324 Major depressive disorder, single episode, in partial remission: Secondary | ICD-10-CM | POA: Diagnosis not present

## 2017-08-02 MED ORDER — SERTRALINE HCL 100 MG PO TABS: 100.0000 mg | ORAL_TABLET | Freq: Every day | ORAL | 5 refills | Status: DC

## 2017-08-02 NOTE — Patient Instructions (Signed)
Take Boost or Ensure supplemental drink 1-2 times daily
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
## 2017-08-03 LAB — CBC WITH DIFFERENTIAL/PLATELET
BASOS: 0 %
Basophils Absolute: 0 10*3/uL (ref 0.0–0.2)
EOS (ABSOLUTE): 0.2 10*3/uL (ref 0.0–0.4)
EOS: 2 %
HEMATOCRIT: 40.9 % (ref 34.0–46.6)
HEMOGLOBIN: 13.5 g/dL (ref 11.1–15.9)
IMMATURE GRANULOCYTES: 0 %
Immature Grans (Abs): 0 10*3/uL (ref 0.0–0.1)
LYMPHS ABS: 2 10*3/uL (ref 0.7–3.1)
Lymphs: 25 %
MCH: 29.3 pg (ref 26.6–33.0)
MCHC: 33 g/dL (ref 31.5–35.7)
MCV: 89 fL (ref 79–97)
MONOCYTES: 9 %
MONOS ABS: 0.7 10*3/uL (ref 0.1–0.9)
NEUTROS PCT: 64 %
Neutrophils Absolute: 5.2 10*3/uL (ref 1.4–7.0)
Platelets: 263 10*3/uL (ref 150–379)
RBC: 4.61 x10E6/uL (ref 3.77–5.28)
RDW: 13.3 % (ref 12.3–15.4)
WBC: 8.1 10*3/uL (ref 3.4–10.8)

## 2017-08-03 LAB — COMPREHENSIVE METABOLIC PANEL
ALBUMIN: 4.7 g/dL (ref 3.5–4.8)
ALT: 7 IU/L (ref 0–32)
AST: 18 IU/L (ref 0–40)
Albumin/Globulin Ratio: 2.1 (ref 1.2–2.2)
Alkaline Phosphatase: 79 IU/L (ref 39–117)
BUN / CREAT RATIO: 17 (ref 12–28)
BUN: 20 mg/dL (ref 8–27)
Bilirubin Total: 0.5 mg/dL (ref 0.0–1.2)
CALCIUM: 9.9 mg/dL (ref 8.7–10.3)
CO2: 28 mmol/L (ref 20–29)
CREATININE: 1.16 mg/dL — AB (ref 0.57–1.00)
Chloride: 103 mmol/L (ref 96–106)
GFR calc Af Amer: 54 mL/min/{1.73_m2} — ABNORMAL LOW (ref >59)
GFR, EST NON AFRICAN AMERICAN: 46 mL/min/{1.73_m2} — AB (ref >59)
GLOBULIN, TOTAL: 2.2 g/dL (ref 1.5–4.5)
Glucose: 93 mg/dL (ref 65–99)
Potassium: 4.5 mmol/L (ref 3.5–5.2)
SODIUM: 145 mmol/L — AB (ref 134–144)
Total Protein: 6.9 g/dL (ref 6.0–8.5)

## 2017-08-03 LAB — TSH: TSH: 0.474 u[IU]/mL (ref 0.450–4.500)

## 2017-08-20 ENCOUNTER — Other Ambulatory Visit: Payer: Self-pay | Admitting: Internal Medicine

## 2017-08-20 DIAGNOSIS — M47816 Spondylosis without myelopathy or radiculopathy, lumbar region: Secondary | ICD-10-CM

## 2017-08-28 ENCOUNTER — Other Ambulatory Visit: Payer: Self-pay | Admitting: Internal Medicine

## 2017-08-28 DIAGNOSIS — I10 Essential (primary) hypertension: Secondary | ICD-10-CM

## 2017-08-28 DIAGNOSIS — F411 Generalized anxiety disorder: Secondary | ICD-10-CM

## 2017-08-29 ENCOUNTER — Other Ambulatory Visit: Payer: Self-pay | Admitting: Internal Medicine

## 2017-08-29 DIAGNOSIS — F411 Generalized anxiety disorder: Secondary | ICD-10-CM

## 2017-10-02 DIAGNOSIS — I10 Essential (primary) hypertension: Secondary | ICD-10-CM | POA: Diagnosis not present

## 2017-10-02 DIAGNOSIS — N183 Chronic kidney disease, stage 3 (moderate): Secondary | ICD-10-CM | POA: Diagnosis not present

## 2017-10-02 DIAGNOSIS — N2581 Secondary hyperparathyroidism of renal origin: Secondary | ICD-10-CM | POA: Diagnosis not present

## 2017-10-15 ENCOUNTER — Other Ambulatory Visit: Payer: Self-pay | Admitting: Internal Medicine

## 2017-10-15 DIAGNOSIS — M47816 Spondylosis without myelopathy or radiculopathy, lumbar region: Secondary | ICD-10-CM

## 2017-12-17 ENCOUNTER — Other Ambulatory Visit: Payer: Self-pay | Admitting: Internal Medicine

## 2017-12-17 DIAGNOSIS — M47816 Spondylosis without myelopathy or radiculopathy, lumbar region: Secondary | ICD-10-CM

## 2017-12-17 MED ORDER — TRAMADOL HCL 50 MG PO TABS: 50.0000 mg | ORAL_TABLET | Freq: Four times a day (QID) | ORAL | 5 refills | Status: DC | PRN

## 2017-12-27 ENCOUNTER — Other Ambulatory Visit: Payer: Self-pay | Admitting: Internal Medicine

## 2017-12-27 DIAGNOSIS — F324 Major depressive disorder, single episode, in partial remission: Secondary | ICD-10-CM

## 2017-12-30 ENCOUNTER — Other Ambulatory Visit: Payer: Self-pay | Admitting: Internal Medicine

## 2018-01-17 ENCOUNTER — Ambulatory Visit: Payer: Medicare Other

## 2018-01-28 ENCOUNTER — Other Ambulatory Visit: Payer: Self-pay

## 2018-01-28 DIAGNOSIS — F324 Major depressive disorder, single episode, in partial remission: Secondary | ICD-10-CM

## 2018-01-28 MED ORDER — SERTRALINE HCL 100 MG PO TABS: 100.0000 mg | ORAL_TABLET | Freq: Every day | ORAL | 5 refills | Status: DC

## 2018-01-29 ENCOUNTER — Encounter: Payer: Self-pay | Admitting: Internal Medicine

## 2018-01-29 ENCOUNTER — Ambulatory Visit (INDEPENDENT_AMBULATORY_CARE_PROVIDER_SITE_OTHER): Payer: PPO | Admitting: Internal Medicine

## 2018-01-29 VITALS — BP 112/50 | HR 71 | Temp 97.9°F | Resp 16 | Ht 61.0 in | Wt 144.0 lb

## 2018-01-29 DIAGNOSIS — I10 Essential (primary) hypertension: Secondary | ICD-10-CM | POA: Diagnosis not present

## 2018-01-29 DIAGNOSIS — E785 Hyperlipidemia, unspecified: Secondary | ICD-10-CM

## 2018-01-29 DIAGNOSIS — L409 Psoriasis, unspecified: Secondary | ICD-10-CM

## 2018-01-29 DIAGNOSIS — Z0001 Encounter for general adult medical examination with abnormal findings: Secondary | ICD-10-CM | POA: Diagnosis not present

## 2018-01-29 DIAGNOSIS — Z1239 Encounter for other screening for malignant neoplasm of breast: Secondary | ICD-10-CM

## 2018-01-29 DIAGNOSIS — N183 Chronic kidney disease, stage 3 unspecified: Secondary | ICD-10-CM

## 2018-01-29 DIAGNOSIS — F324 Major depressive disorder, single episode, in partial remission: Secondary | ICD-10-CM

## 2018-01-29 DIAGNOSIS — Z0000 Encounter for general adult medical examination without abnormal findings: Secondary | ICD-10-CM

## 2018-01-29 LAB — POCT URINALYSIS DIPSTICK
Bilirubin, UA: NEGATIVE
Glucose, UA: NEGATIVE
Ketones, UA: NEGATIVE
NITRITE UA: NEGATIVE
PROTEIN UA: NEGATIVE
Urobilinogen, UA: 0.2 E.U./dL
pH, UA: 7 (ref 5.0–8.0)

## 2018-01-29 MED ORDER — BETAMETHASONE DIPROPIONATE 0.05 % EX CREA: TOPICAL_CREAM | Freq: Two times a day (BID) | CUTANEOUS | 0 refills | Status: DC

## 2018-01-29 MED ORDER — CLOTRIMAZOLE-BETAMETHASONE 1-0.05 % EX CREA: TOPICAL_CREAM | CUTANEOUS | 0 refills | Status: DC

## 2018-01-29 NOTE — Progress Notes (Signed)
Date:  01/29/2018   Name:  Veronica Rowe   DOB:  1941/04/14   MRN:  [id number]   Chief Complaint: Annual Exam Veronica Rowe is a 77 y.o. female who presents today for her Complete Annual Exam. She feels fairly well. She reports exercising none but caring for sick husband. She reports she is sleeping poorly. Her mother passed away recently.  Husband is now at home from urosepsis.  She declines colonoscopy.  She is due for Mammogram at [facility] imaging.  Hypertension  This is a chronic problem. The problem is unchanged. The problem is controlled. Pertinent negatives include no chest pain, headaches, palpitations or shortness of breath. Past treatments include angiotensin blockers. The current treatment provides significant improvement.  Hyperlipidemia  This is a chronic problem. The problem is controlled. Pertinent negatives include no chest pain or shortness of breath. Current antihyperlipidemic treatment includes statins. The current treatment provides significant improvement of lipids.  Depression - doing well on medications, appetite was down but weight is stable.  She is sleeping well.  No suicidal thoughts, confusion or sleep issues.   Review of Systems  Constitutional: Positive for appetite change and fatigue. Negative for chills and fever.  HENT: Negative for congestion, hearing loss, tinnitus, trouble swallowing and voice change.   Eyes: Negative for visual disturbance.  Respiratory: Negative for cough, chest tightness, shortness of breath and wheezing.   Cardiovascular: Negative for chest pain, palpitations and leg swelling.  Gastrointestinal: Negative for abdominal pain, constipation, diarrhea and vomiting.  Endocrine: Negative for polydipsia and polyuria.  Genitourinary: Negative for dysuria, frequency, genital sores, vaginal bleeding and vaginal discharge.  Musculoskeletal: Negative for arthralgias, gait problem and joint swelling.  Skin: Negative for color change and rash.    Neurological: Negative for dizziness, tremors, light-headedness and headaches.  Hematological: Negative for adenopathy. Does not bruise/bleed easily.  Psychiatric/Behavioral: Positive for dysphoric mood and sleep disturbance. Negative for confusion and hallucinations. The patient is not nervous/anxious.     Patient Active Problem List   Diagnosis Date Noted  . Lumbar spondylosis 03/27/2016  . Vitamin D deficiency 08/17/2015  . Essential hypertension 07/07/2015  . Major depressive disorder with single episode, in partial remission ([place]) 07/07/2015  . Chronic renal disease, stage III ([place]) 06/26/2015  . Hx of peptic ulcer 06/26/2015  . Generalized psoriasis 06/26/2015  . Stress incontinence, female 06/26/2015  . Dyslipidemia 06/26/2015  . Generalized anxiety disorder 06/26/2015  . Atypical hyperplasia of breast 11/12/2013  . Microcalcifications of the breast     Prior to Admission medications   Medication Sig Start Date End Date Taking? Authorizing Provider  acetaminophen (TYLENOL) 325 MG tablet Take 650 mg by mouth every 6 (six) hours as needed.   Yes [provider]  ALPRAZolam Duanne Moron) 0.25 MG tablet TAKE ONE TABLET BY MOUTH TWICE DAILY AS NEEDED FOR ANXIETY 08/29/17  Yes Glean Hess, MD  cholecalciferol (VITAMIN D) 1000 UNITS tablet Take 1,000 Units by mouth daily.   Yes [provider]  clotrimazole-betamethasone (LOTRISONE) cream APPLY TO AFFECTED AREA TWICE A DAY AS DIRECTED BY PHYSICIAN 02/28/17  Yes Glean Hess, MD  fluticasone [facility]) 50 MCG/ACT nasal spray Place 2 sprays into both nostrils daily. 08/11/15  Yes [provider]  losartan (COZAAR) 100 MG tablet TAKE ONE TABLET BY MOUTH EVERY DAY 07/09/17  Yes Glean Hess, MD  mirtazapine (REMERON) 15 MG tablet TAKE ONE TABLET BY MOUTH AT BEDTIME 12/27/17  Yes Glean Hess, MD  omeprazole [facility])  20 MG capsule TAKE ONE CAPSULE BY MOUTH TWICE DAILY 07/24/17  Yes Glean Hess,  MD  sertraline (ZOLOFT) 100 MG tablet Take 1 tablet (100 mg total) by mouth daily. 01/28/18  Yes Glean Hess, MD  simvastatin (ZOCOR) 20 MG tablet TAKE ONE TABLET BY MOUTH EVERY EVENING 12/30/17  Yes Glean Hess, MD  traMADol (ULTRAM) 50 MG tablet Take 1 tablet (50 mg total) by mouth every 6 (six) hours as needed. 12/17/17  Yes Glean Hess, MD  triamterene-hydrochlorothiazide [facility]) 37.5-25 MG tablet TAKE ONE TABLET BY MOUTH EVERY DAY 08/29/17  Yes Glean Hess, MD    Allergies  Allergen Reactions  . Codeine Itching and Other (See Comments)    hallucinations    Past Surgical History:  Procedure Laterality Date  . BREAST BIOPSY  2013   The patient's Baker Janus model risk for breast cancer is 1.4% for the next 5 years, 4.4% lifetime    Social History   Tobacco Use  . Smoking status: Never Smoker  . Smokeless tobacco: Never Used  Substance Use Topics  . Alcohol use: No    Alcohol/week: 0.0 oz  . Drug use: No     Medication list has been reviewed and updated.  Current Meds  Medication Sig  . acetaminophen (TYLENOL) 325 MG tablet Take 650 mg by mouth every 6 (six) hours as needed.  . ALPRAZolam (XANAX) 0.25 MG tablet TAKE ONE TABLET BY MOUTH TWICE DAILY AS NEEDED FOR ANXIETY  . cholecalciferol (VITAMIN D) 1000 UNITS tablet Take 1,000 Units by mouth daily.  . clotrimazole-betamethasone (LOTRISONE) cream APPLY TO AFFECTED AREA TWICE A DAY AS DIRECTED BY PHYSICIAN  . fluticasone (FLONASE) 50 MCG/ACT nasal spray Place 2 sprays into both nostrils daily.  Marland Kitchen losartan (COZAAR) 100 MG tablet TAKE ONE TABLET BY MOUTH EVERY DAY  . mirtazapine (REMERON) 15 MG tablet TAKE ONE TABLET BY MOUTH AT BEDTIME  . omeprazole (PRILOSEC) 20 MG capsule TAKE ONE CAPSULE BY MOUTH TWICE DAILY  . sertraline (ZOLOFT) 100 MG tablet Take 1 tablet (100 mg total) by mouth daily.  . simvastatin (ZOCOR) 20 MG tablet TAKE ONE TABLET BY MOUTH EVERY EVENING  . traMADol (ULTRAM) 50 MG tablet Take 1  tablet (50 mg total) by mouth every 6 (six) hours as needed.  . triamterene-hydrochlorothiazide (MAXZIDE-25) 37.5-25 MG tablet TAKE ONE TABLET BY MOUTH EVERY DAY    PHQ 2/9 Scores 01/29/2018 08/02/2017 01/16/2017 01/16/2017  PHQ - 2 Score 0 4 2 1   PHQ- 9 Score 7 9 11 10     Physical Exam  Constitutional: She is oriented to person, place, and time. She appears well-developed and well-nourished. No distress.  HENT:  Head: Normocephalic and atraumatic.  Right Ear: Tympanic membrane and ear canal normal.  Left Ear: Tympanic membrane and ear canal normal.  Nose: Right sinus exhibits no maxillary sinus tenderness. Left sinus exhibits no maxillary sinus tenderness.  Mouth/Throat: Uvula is midline and oropharynx is clear and moist.  Eyes: Conjunctivae and EOM are normal. Right eye exhibits no discharge. Left eye exhibits no discharge. No scleral icterus.  Neck: Normal range of motion. Carotid bruit is not present. No erythema present. No thyromegaly present.  Cardiovascular: Normal rate, regular rhythm, normal heart sounds and normal pulses.  Pulmonary/Chest: Effort normal. No respiratory distress. She has no wheezes. Right breast exhibits no mass, no nipple discharge, no skin change and no tenderness. Left breast exhibits no mass, no nipple discharge, no skin change and no tenderness.  Abdominal: Soft.  Bowel sounds are normal. There is no hepatosplenomegaly. There is no tenderness. There is no CVA tenderness.  Musculoskeletal: Normal range of motion.  Lymphadenopathy:    She has no cervical adenopathy.    She has no axillary adenopathy.  Neurological: She is alert and oriented to person, place, and time. She has normal reflexes. No cranial nerve deficit or sensory deficit.  Skin: Skin is warm, dry and intact. Rash noted.  Patch of psoriasis on lower back, right nipple and in posterior hairline.  Psychiatric: She has a normal mood and affect. Her speech is normal and behavior is normal. Thought  content normal.  Nursing note and vitals reviewed.   BP (!) 112/50   Pulse 71   Temp 97.9 F (36.6 C) (Oral)   Resp 16   Ht 5\' 1"  (1.549 m)   Wt 144 lb (65.3 kg)   SpO2 96%   BMI 27.21 kg/m   Assessment and Plan: 1. Annual physical exam Normal exam Pt declines colon cancer screening Pt declines DEXA - POCT urinalysis dipstick  2. Breast cancer screening Schedule at [facility] imaging  3. Essential hypertension controlled - CBC with Differential/Platelet  4. Generalized psoriasis Begin topical steroids  - betamethasone dipropionate (DIPROLENE) 0.05 % cream; Apply topically 2 (two) times daily.  Dispense: 30 g; Refill: 0  5. Chronic renal disease, stage III (HCC) Check labs - Comprehensive metabolic panel  6. Major depressive disorder with single episode, in partial remission ([place]) Doing well on medications despite recent stressors - TSH  7. Dyslipidemia On statin therapy - Lipid panel   Meds ordered this encounter  Medications  . betamethasone dipropionate (DIPROLENE) 0.05 % cream    Sig: Apply topically 2 (two) times daily.    Dispense:  30 g    Refill:  0  . clotrimazole-betamethasone (LOTRISONE) cream    Sig: APPLY TO AFFECTED AREA TWICE A DAY AS DIRECTED BY PHYSICIAN    Dispense:  45 g    Refill:  0    Partially dictated using Editor, commissioning. Any errors are unintentional.  Halina Maidens, MD [facility]  01/29/2018

## 2018-01-30 LAB — COMPREHENSIVE METABOLIC PANEL
ALBUMIN: 4.5 g/dL (ref 3.5–4.8)
ALK PHOS: 98 IU/L (ref 39–117)
ALT: 10 IU/L (ref 0–32)
AST: 18 IU/L (ref 0–40)
Albumin/Globulin Ratio: 1.6 (ref 1.2–2.2)
BILIRUBIN TOTAL: 0.5 mg/dL (ref 0.0–1.2)
BUN/Creatinine Ratio: 21 (ref 12–28)
BUN: 29 mg/dL — ABNORMAL HIGH (ref 8–27)
CHLORIDE: 100 mmol/L (ref 96–106)
CO2: 27 mmol/L (ref 20–29)
CREATININE: 1.38 mg/dL — AB (ref 0.57–1.00)
Calcium: 9.8 mg/dL (ref 8.7–10.3)
GFR calc non Af Amer: 38 mL/min/{1.73_m2} — ABNORMAL LOW (ref >59)
GFR, EST AFRICAN AMERICAN: 43 mL/min/{1.73_m2} — AB (ref >59)
GLUCOSE: 87 mg/dL (ref 65–99)
Globulin, Total: 2.9 g/dL (ref 1.5–4.5)
Potassium: 4.5 mmol/L (ref 3.5–5.2)
Sodium: 143 mmol/L (ref 134–144)
TOTAL PROTEIN: 7.4 g/dL (ref 6.0–8.5)

## 2018-01-30 LAB — CBC WITH DIFFERENTIAL/PLATELET
Basophils Absolute: 0 10*3/uL (ref 0.0–0.2)
Basos: 0 %
EOS (ABSOLUTE): 0.3 10*3/uL (ref 0.0–0.4)
Eos: 4 %
HEMOGLOBIN: 13.5 g/dL (ref 11.1–15.9)
Hematocrit: 40.3 % (ref 34.0–46.6)
IMMATURE GRANS (ABS): 0 10*3/uL (ref 0.0–0.1)
Immature Granulocytes: 0 %
LYMPHS: 19 %
Lymphocytes Absolute: 1.6 10*3/uL (ref 0.7–3.1)
MCH: 29.3 pg (ref 26.6–33.0)
MCHC: 33.5 g/dL (ref 31.5–35.7)
MCV: 87 fL (ref 79–97)
MONOCYTES: 10 %
Monocytes Absolute: 0.8 10*3/uL (ref 0.1–0.9)
NEUTROS PCT: 67 %
Neutrophils Absolute: 5.4 10*3/uL (ref 1.4–7.0)
PLATELETS: 268 10*3/uL (ref 150–450)
RBC: 4.61 x10E6/uL (ref 3.77–5.28)
RDW: 13.2 % (ref 12.3–15.4)
WBC: 8.1 10*3/uL (ref 3.4–10.8)

## 2018-01-30 LAB — LIPID PANEL
CHOLESTEROL TOTAL: 159 mg/dL (ref 100–199)
Chol/HDL Ratio: 2.6 ratio (ref 0.0–4.4)
HDL: 62 mg/dL (ref >39)
LDL CALC: 78 mg/dL (ref 0–99)
Triglycerides: 97 mg/dL (ref 0–149)
VLDL CHOLESTEROL CAL: 19 mg/dL (ref 5–40)

## 2018-01-30 LAB — TSH: TSH: 0.961 u[IU]/mL (ref 0.450–4.500)

## 2018-01-31 ENCOUNTER — Encounter: Payer: Medicare Other | Admitting: Internal Medicine

## 2018-02-07 DIAGNOSIS — N2581 Secondary hyperparathyroidism of renal origin: Secondary | ICD-10-CM | POA: Diagnosis not present

## 2018-02-07 DIAGNOSIS — I10 Essential (primary) hypertension: Secondary | ICD-10-CM | POA: Diagnosis not present

## 2018-02-07 DIAGNOSIS — N183 Chronic kidney disease, stage 3 (moderate): Secondary | ICD-10-CM | POA: Diagnosis not present

## 2018-03-05 ENCOUNTER — Other Ambulatory Visit: Payer: Self-pay | Admitting: Internal Medicine

## 2018-03-05 DIAGNOSIS — F411 Generalized anxiety disorder: Secondary | ICD-10-CM

## 2018-03-05 MED ORDER — ALPRAZOLAM 0.25 MG PO TABS: 0.2500 mg | ORAL_TABLET | Freq: Two times a day (BID) | ORAL | 5 refills | Status: DC | PRN

## 2018-05-01 ENCOUNTER — Other Ambulatory Visit: Payer: Self-pay | Admitting: Internal Medicine

## 2018-05-01 DIAGNOSIS — Z1231 Encounter for screening mammogram for malignant neoplasm of breast: Secondary | ICD-10-CM

## 2018-05-20 ENCOUNTER — Other Ambulatory Visit: Payer: Self-pay

## 2018-05-20 DIAGNOSIS — F324 Major depressive disorder, single episode, in partial remission: Secondary | ICD-10-CM

## 2018-05-20 MED ORDER — MIRTAZAPINE 15 MG PO TABS
15.0000 mg | ORAL_TABLET | Freq: Every day | ORAL | 3 refills | Status: DC
Start: 2018-05-20 — End: 2018-09-30

## 2018-05-22 ENCOUNTER — Ambulatory Visit
Admission: RE | Admit: 2018-05-22 | Discharge: 2018-05-22 | Disposition: A | Discharge status: Home / Self Care | Payer: PPO | Source: Ambulatory Visit | Attending: Internal Medicine | Admitting: Internal Medicine

## 2018-05-22 DIAGNOSIS — Z1231 Encounter for screening mammogram for malignant neoplasm of breast: Secondary | ICD-10-CM | POA: Insufficient documentation

## 2018-06-24 ENCOUNTER — Other Ambulatory Visit: Payer: Self-pay | Admitting: Internal Medicine

## 2018-06-24 MED ORDER — OMEPRAZOLE 20 MG PO CPDR: 20.0000 mg | DELAYED_RELEASE_CAPSULE | Freq: Two times a day (BID) | ORAL | 5 refills | Status: DC

## 2018-07-01 ENCOUNTER — Other Ambulatory Visit: Payer: Self-pay

## 2018-07-01 MED ORDER — SIMVASTATIN 20 MG PO TABS: 20.0000 mg | ORAL_TABLET | Freq: Every evening | ORAL | 5 refills | Status: DC

## 2018-07-17 ENCOUNTER — Other Ambulatory Visit: Payer: Self-pay

## 2018-07-17 DIAGNOSIS — I10 Essential (primary) hypertension: Secondary | ICD-10-CM

## 2018-07-17 MED ORDER — LOSARTAN POTASSIUM 100 MG PO TABS: 100.0000 mg | ORAL_TABLET | Freq: Every day | ORAL | 1 refills | Status: DC

## 2018-07-26 ENCOUNTER — Other Ambulatory Visit: Payer: Self-pay | Admitting: Internal Medicine

## 2018-07-26 DIAGNOSIS — M47816 Spondylosis without myelopathy or radiculopathy, lumbar region: Secondary | ICD-10-CM

## 2018-07-26 MED ORDER — TRAMADOL HCL 50 MG PO TABS: 50.0000 mg | ORAL_TABLET | Freq: Four times a day (QID) | ORAL | 0 refills | Status: DC | PRN

## 2018-08-02 DIAGNOSIS — I10 Essential (primary) hypertension: Secondary | ICD-10-CM | POA: Diagnosis not present

## 2018-08-02 DIAGNOSIS — N182 Chronic kidney disease, stage 2 (mild): Secondary | ICD-10-CM | POA: Diagnosis not present

## 2018-08-02 DIAGNOSIS — R809 Proteinuria, unspecified: Secondary | ICD-10-CM | POA: Diagnosis not present

## 2018-08-05 ENCOUNTER — Telehealth: Payer: Self-pay

## 2018-08-05 ENCOUNTER — Other Ambulatory Visit: Payer: Self-pay | Admitting: Internal Medicine

## 2018-08-05 DIAGNOSIS — F411 Generalized anxiety disorder: Secondary | ICD-10-CM

## 2018-08-05 DIAGNOSIS — F324 Major depressive disorder, single episode, in partial remission: Secondary | ICD-10-CM

## 2018-08-05 MED ORDER — ALPRAZOLAM 0.25 MG PO TABS: 0.2500 mg | ORAL_TABLET | Freq: Two times a day (BID) | ORAL | 0 refills | Status: DC | PRN

## 2018-08-05 MED ORDER — SERTRALINE HCL 100 MG PO TABS: 100.0000 mg | ORAL_TABLET | Freq: Every day | ORAL | 0 refills | Status: DC

## 2018-08-05 NOTE — Telephone Encounter (Signed)
Sertraline #30 and Xanax #20 sent to [facility].  Will need to see next week for OV.

## 2018-08-05 NOTE — Telephone Encounter (Signed)
Patient called and left VM stating she needs refills on sertraline and xanax because she is out of both.  Called patient back and spoke with patient. Said her daughter called the VM line and left the VM but pretended to be her.  Informed her we needs to see her every 6 months in order to do these refills. Also, told her after speaking to Dr Army Melia that her Xanax should not be out and she should have enough to get her through January because she should only be taking BID as needed.   Told patient Dr Army Melia can send in her Zoloft for 30 days to give her time to make an appt but she cannot send in Xanax at this time. Patient said she has been going through "hell" for the last few months. Said her mother passed away in 12-21-2022 and now her husband has been in and out of the hospital for the last 3 months so she is having to take her Xanax twice everyday and this why she is out. She said she is going through a lot. Getting ready to go out of town for the holidays and will be back in town this Sunday. Michela Pitcher she could come in Monday or Tuesday of next week but wants to know if she can have enough xanax to get her until then.   Please Advise.

## 2018-08-05 NOTE — Telephone Encounter (Signed)
Patient informed. Scheduled for Jan 6th for follow up.

## 2018-08-19 ENCOUNTER — Ambulatory Visit (INDEPENDENT_AMBULATORY_CARE_PROVIDER_SITE_OTHER): Payer: PPO | Admitting: Internal Medicine

## 2018-08-19 ENCOUNTER — Encounter: Payer: Self-pay | Admitting: Internal Medicine

## 2018-08-19 VITALS — BP 114/78 | HR 81 | Ht 64.0 in | Wt 142.0 lb

## 2018-08-19 DIAGNOSIS — I10 Essential (primary) hypertension: Secondary | ICD-10-CM

## 2018-08-19 DIAGNOSIS — N183 Chronic kidney disease, stage 3 unspecified: Secondary | ICD-10-CM

## 2018-08-19 DIAGNOSIS — F324 Major depressive disorder, single episode, in partial remission: Secondary | ICD-10-CM

## 2018-08-19 MED ORDER — SERTRALINE HCL 100 MG PO TABS: 100.0000 mg | ORAL_TABLET | Freq: Every day | ORAL | 5 refills | Status: DC

## 2018-08-19 NOTE — Progress Notes (Signed)
Date:  08/19/2018   Name:  Veronica Rowe   DOB:  Jan 17, 1941   MRN:  [id number]   Chief Complaint: Hypertension (Follow up. )  Hypertension  This is a chronic problem. The problem is controlled. Pertinent negatives include no chest pain, headaches, palpitations or shortness of breath. Past treatments include angiotensin blockers. The current treatment provides significant improvement.  Depression         This is a chronic problem.  The problem has been resolved since onset.  Associated symptoms include no fatigue, no myalgias, no headaches and no suicidal ideas.  Past treatments include SSRIs - Selective serotonin reuptake inhibitors.  Previous treatment provided significant relief. Renal insuff - needs to be monitored at intervals.  Seeing Nephrology and last labs not available in chart.  Lab Results  Component Value Date   CREATININE 1.38 (H) 01/29/2018   BUN 29 (H) 01/29/2018   NA 143 01/29/2018   K 4.5 01/29/2018   CL 100 01/29/2018   CO2 27 01/29/2018     Review of Systems  Constitutional: Negative for chills, fatigue and fever.  Respiratory: Negative for shortness of breath and wheezing.   Cardiovascular: Negative for chest pain and palpitations.  Gastrointestinal: Negative for constipation and diarrhea.  Musculoskeletal: Negative for myalgias.  Skin: Negative for color change and rash.  Neurological: Negative for dizziness, light-headedness and headaches.  Hematological: Negative for adenopathy.  Psychiatric/Behavioral: Positive for depression. Negative for sleep disturbance and suicidal ideas. The patient is not nervous/anxious.     Patient Active Problem List   Diagnosis Date Noted  . Lumbar spondylosis 03/27/2016  . Vitamin D deficiency 08/17/2015  . Essential hypertension 07/07/2015  . Major depressive disorder with single episode, in partial remission ([place]) 07/07/2015  . Chronic renal disease, stage III ([place]) 06/26/2015  . Hx of peptic ulcer 06/26/2015  .  Generalized psoriasis 06/26/2015  . Stress incontinence, female 06/26/2015  . Dyslipidemia 06/26/2015  . Generalized anxiety disorder 06/26/2015  . Atypical hyperplasia of breast 11/12/2013  . Microcalcifications of the breast     Allergies  Allergen Reactions  . Codeine Itching and Other (See Comments)    hallucinations    Past Surgical History:  Procedure Laterality Date  . BREAST EXCISIONAL BIOPSY Right 2013   The patient's Baker Janus model risk for breast cancer is 1.4% for the next 5 years, 4.4% lifetime    Social History   Tobacco Use  . Smoking status: Never Smoker  . Smokeless tobacco: Never Used  Substance Use Topics  . Alcohol use: No    Alcohol/week: 0.0 standard drinks  . Drug use: No     Medication list has been reviewed and updated.  Current Meds  Medication Sig  . acetaminophen (TYLENOL) 325 MG tablet Take 650 mg by mouth every 6 (six) hours as needed.  . ALPRAZolam (XANAX) 0.25 MG tablet Take 1 tablet (0.25 mg total) by mouth 2 (two) times daily as needed. for anxiety  . betamethasone dipropionate (DIPROLENE) 0.05 % cream Apply topically 2 (two) times daily.  . cholecalciferol (VITAMIN D) 1000 UNITS tablet Take 1,000 Units by mouth daily.  . clotrimazole-betamethasone (LOTRISONE) cream APPLY TO AFFECTED AREA TWICE A DAY AS DIRECTED BY PHYSICIAN  . fluticasone (FLONASE) 50 MCG/ACT nasal spray Place 2 sprays into both nostrils daily.  Marland Kitchen losartan (COZAAR) 100 MG tablet Take 1 tablet (100 mg total) by mouth daily.  . mirtazapine (REMERON) 15 MG tablet Take 1 tablet (15 mg total) by mouth at bedtime.  Marland Kitchen  omeprazole (PRILOSEC) 20 MG capsule Take 1 capsule (20 mg total) by mouth 2 (two) times daily.  . sertraline (ZOLOFT) 100 MG tablet Take 1 tablet (100 mg total) by mouth daily.  . simvastatin (ZOCOR) 20 MG tablet Take 1 tablet (20 mg total) by mouth every evening.  . traMADol (ULTRAM) 50 MG tablet Take 1 tablet (50 mg total) by mouth every 6 (six) hours as needed.    . triamterene-hydrochlorothiazide (MAXZIDE-25) 37.5-25 MG tablet TAKE ONE TABLET BY MOUTH EVERY DAY    PHQ 2/9 Scores 08/19/2018 01/29/2018 08/02/2017 01/16/2017  PHQ - 2 Score 2 0 4 2  PHQ- 9 Score 2 7 9 11     Physical Exam Vitals signs and nursing note reviewed.  Constitutional:      General: She is not in acute distress.    Appearance: She is well-developed.  HENT:     Head: Normocephalic and atraumatic.     Nose: Nose normal.  Neck:     Musculoskeletal: Normal range of motion and neck supple.  Cardiovascular:     Rate and Rhythm: Normal rate and regular rhythm.     Pulses: Normal pulses.     Heart sounds: Normal heart sounds.  Pulmonary:     Effort: Pulmonary effort is normal. No respiratory distress.  Musculoskeletal: Normal range of motion.  Lymphadenopathy:     Cervical: No cervical adenopathy.  Skin:    General: Skin is warm and dry.     Findings: No rash.  Neurological:     Mental Status: She is alert and oriented to person, place, and time.  Psychiatric:        Behavior: Behavior normal.        Thought Content: Thought content normal.    Wt Readings from Last 3 Encounters:  08/19/18 142 lb (64.4 kg)  01/29/18 144 lb (65.3 kg)  08/02/17 141 lb (64 kg)    BP 114/78 (BP Location: Right Arm, Patient Position: Sitting, Cuff Size: Normal)   Pulse 81   Ht 5\' 4"  (1.626 m)   Wt 142 lb (64.4 kg)   SpO2 99%   BMI 24.37 kg/m   Assessment and Plan: 1. Chronic renal disease, stage III [facility]) Now being followed by Nephrology - Basic metabolic panel  2. Essential hypertension Controlled on current therapy  Losartan and diuretic  3. Major depressive disorder with single episode, in partial remission ([place]) Doing well on zoloft - sertraline (ZOLOFT) 100 MG tablet; Take 1 tablet (100 mg total) by mouth daily.  Dispense: 30 tablet; Refill: 5   Partially dictated using Editor, commissioning. Any errors are unintentional.  Halina Maidens, MD [facility]  08/19/2018

## 2018-08-20 ENCOUNTER — Telehealth: Payer: Self-pay

## 2018-08-20 ENCOUNTER — Other Ambulatory Visit: Payer: Self-pay | Admitting: Internal Medicine

## 2018-08-20 DIAGNOSIS — F411 Generalized anxiety disorder: Secondary | ICD-10-CM

## 2018-08-20 LAB — BASIC METABOLIC PANEL
BUN/Creatinine Ratio: 16 (ref 12–28)
BUN: 19 mg/dL (ref 8–27)
CALCIUM: 9.8 mg/dL (ref 8.7–10.3)
CO2: 26 mmol/L (ref 20–29)
CREATININE: 1.17 mg/dL — AB (ref 0.57–1.00)
Chloride: 100 mmol/L (ref 96–106)
GFR, EST AFRICAN AMERICAN: 53 mL/min/{1.73_m2} — AB (ref >59)
GFR, EST NON AFRICAN AMERICAN: 46 mL/min/{1.73_m2} — AB (ref >59)
Glucose: 88 mg/dL (ref 65–99)
POTASSIUM: 4.7 mmol/L (ref 3.5–5.2)
Sodium: 141 mmol/L (ref 134–144)

## 2018-08-20 MED ORDER — ALPRAZOLAM 0.25 MG PO TABS: 0.2500 mg | ORAL_TABLET | Freq: Two times a day (BID) | ORAL | 5 refills | Status: DC | PRN

## 2018-08-20 NOTE — Telephone Encounter (Signed)
[place] called stating patient was seen here and patient did received new Rx for Xanax. Patient would like medication sent to [facility].  Please Advise.

## 2018-09-09 ENCOUNTER — Other Ambulatory Visit: Payer: Self-pay

## 2018-09-09 DIAGNOSIS — I10 Essential (primary) hypertension: Secondary | ICD-10-CM

## 2018-09-09 MED ORDER — TRIAMTERENE-HCTZ 37.5-25 MG PO TABS: 1.0000 | ORAL_TABLET | Freq: Every day | ORAL | 5 refills | Status: DC

## 2018-09-13 ENCOUNTER — Other Ambulatory Visit: Payer: Self-pay | Admitting: Internal Medicine

## 2018-09-13 DIAGNOSIS — M47816 Spondylosis without myelopathy or radiculopathy, lumbar region: Secondary | ICD-10-CM

## 2018-09-13 MED ORDER — TRAMADOL HCL 50 MG PO TABS: 50.0000 mg | ORAL_TABLET | Freq: Four times a day (QID) | ORAL | 2 refills | Status: DC | PRN

## 2018-09-30 ENCOUNTER — Other Ambulatory Visit: Payer: Self-pay

## 2018-09-30 DIAGNOSIS — F324 Major depressive disorder, single episode, in partial remission: Secondary | ICD-10-CM

## 2018-09-30 MED ORDER — MIRTAZAPINE 15 MG PO TABS: 15.0000 mg | ORAL_TABLET | Freq: Every day | ORAL | 3 refills | Status: DC

## 2019-01-03 ENCOUNTER — Other Ambulatory Visit: Payer: Self-pay | Admitting: Internal Medicine

## 2019-01-03 DIAGNOSIS — I10 Essential (primary) hypertension: Secondary | ICD-10-CM

## 2019-01-03 DIAGNOSIS — M47816 Spondylosis without myelopathy or radiculopathy, lumbar region: Secondary | ICD-10-CM

## 2019-01-03 MED ORDER — TRAMADOL HCL 50 MG PO TABS: 50.0000 mg | ORAL_TABLET | Freq: Four times a day (QID) | ORAL | 2 refills | Status: DC | PRN

## 2019-01-03 MED ORDER — SIMVASTATIN 20 MG PO TABS: 20.0000 mg | ORAL_TABLET | Freq: Every evening | ORAL | 1 refills | Status: DC

## 2019-01-03 MED ORDER — LOSARTAN POTASSIUM 100 MG PO TABS: 100.0000 mg | ORAL_TABLET | Freq: Every day | ORAL | 1 refills | Status: DC

## 2019-01-30 ENCOUNTER — Other Ambulatory Visit: Payer: Self-pay

## 2019-01-30 DIAGNOSIS — F324 Major depressive disorder, single episode, in partial remission: Secondary | ICD-10-CM

## 2019-01-30 MED ORDER — MIRTAZAPINE 15 MG PO TABS: 15.0000 mg | ORAL_TABLET | Freq: Every day | ORAL | 1 refills | Status: DC

## 2019-02-03 ENCOUNTER — Encounter: Payer: BC Managed Care – PPO | Admitting: Internal Medicine

## 2019-02-27 ENCOUNTER — Telehealth: Payer: Self-pay

## 2019-02-27 ENCOUNTER — Other Ambulatory Visit: Payer: Self-pay

## 2019-02-27 DIAGNOSIS — I10 Essential (primary) hypertension: Secondary | ICD-10-CM

## 2019-02-27 DIAGNOSIS — F324 Major depressive disorder, single episode, in partial remission: Secondary | ICD-10-CM

## 2019-02-27 MED ORDER — SERTRALINE HCL 100 MG PO TABS: 100.0000 mg | ORAL_TABLET | Freq: Every day | ORAL | 4 refills | Status: DC

## 2019-02-27 MED ORDER — TRIAMTERENE-HCTZ 37.5-25 MG PO TABS: 1.0000 | ORAL_TABLET | Freq: Every day | ORAL | 4 refills | Status: DC

## 2019-02-27 NOTE — Telephone Encounter (Signed)
[place] called to request Rfs for patients Xanax Rx.  -please advise.  (had to put this note in twice, did not tag you in previous telephone call)

## 2019-02-27 NOTE — Telephone Encounter (Signed)
[place] called saying patient needs Rfs on Xanax.  - Please Advise.

## 2019-03-03 ENCOUNTER — Other Ambulatory Visit: Payer: Self-pay | Admitting: Internal Medicine

## 2019-03-03 DIAGNOSIS — F411 Generalized anxiety disorder: Secondary | ICD-10-CM

## 2019-03-03 MED ORDER — ALPRAZOLAM 0.25 MG PO TABS: 0.2500 mg | ORAL_TABLET | Freq: Two times a day (BID) | ORAL | 3 refills | Status: DC | PRN

## 2019-03-03 NOTE — Telephone Encounter (Signed)
I sent in the refill.

## 2019-03-29 ENCOUNTER — Other Ambulatory Visit: Payer: Self-pay | Admitting: Internal Medicine

## 2019-03-29 DIAGNOSIS — F324 Major depressive disorder, single episode, in partial remission: Secondary | ICD-10-CM

## 2019-04-22 ENCOUNTER — Other Ambulatory Visit: Payer: Self-pay | Admitting: Internal Medicine

## 2019-04-22 DIAGNOSIS — Z1231 Encounter for screening mammogram for malignant neoplasm of breast: Secondary | ICD-10-CM

## 2019-04-30 ENCOUNTER — Other Ambulatory Visit: Payer: Self-pay | Admitting: Internal Medicine

## 2019-04-30 DIAGNOSIS — M47816 Spondylosis without myelopathy or radiculopathy, lumbar region: Secondary | ICD-10-CM

## 2019-05-30 ENCOUNTER — Ambulatory Visit
Admission: RE | Admit: 2019-05-30 | Discharge: 2019-05-30 | Disposition: A | Discharge status: Home / Self Care | Payer: PPO | Source: Ambulatory Visit | Attending: Internal Medicine | Admitting: Internal Medicine

## 2019-05-30 DIAGNOSIS — Z1231 Encounter for screening mammogram for malignant neoplasm of breast: Secondary | ICD-10-CM | POA: Diagnosis not present

## 2019-06-03 ENCOUNTER — Inpatient Hospital Stay
Admission: RE | Admit: 2019-06-03 | Discharge: 2019-06-03 | Disposition: A | Discharge status: Home / Self Care | Payer: Self-pay | Source: Ambulatory Visit | Attending: Internal Medicine | Admitting: Internal Medicine

## 2019-06-03 ENCOUNTER — Other Ambulatory Visit: Payer: Self-pay | Admitting: Internal Medicine

## 2019-06-03 DIAGNOSIS — Z1231 Encounter for screening mammogram for malignant neoplasm of breast: Secondary | ICD-10-CM

## 2019-06-21 ENCOUNTER — Other Ambulatory Visit: Payer: Self-pay | Admitting: Internal Medicine

## 2019-07-04 ENCOUNTER — Other Ambulatory Visit: Payer: Self-pay

## 2019-07-04 ENCOUNTER — Ambulatory Visit (INDEPENDENT_AMBULATORY_CARE_PROVIDER_SITE_OTHER): Payer: PPO | Admitting: Internal Medicine

## 2019-07-04 ENCOUNTER — Encounter: Payer: Self-pay | Admitting: Internal Medicine

## 2019-07-04 VITALS — BP 136/80 | HR 78 | Ht 64.0 in | Wt 150.0 lb

## 2019-07-04 DIAGNOSIS — E785 Hyperlipidemia, unspecified: Secondary | ICD-10-CM | POA: Diagnosis not present

## 2019-07-04 DIAGNOSIS — Z0000 Encounter for general adult medical examination without abnormal findings: Secondary | ICD-10-CM | POA: Diagnosis not present

## 2019-07-04 DIAGNOSIS — I10 Essential (primary) hypertension: Secondary | ICD-10-CM | POA: Diagnosis not present

## 2019-07-04 DIAGNOSIS — Z23 Encounter for immunization: Secondary | ICD-10-CM | POA: Diagnosis not present

## 2019-07-04 DIAGNOSIS — F324 Major depressive disorder, single episode, in partial remission: Secondary | ICD-10-CM

## 2019-07-04 NOTE — Progress Notes (Signed)
Date:  07/04/2019   Name:  Veronica Rowe   DOB:  Jul 18, 1941   MRN:  [id number]   Chief Complaint: Annual Exam (Breast Exam. High dose Flu shot. ) Veronica Rowe is a 78 y.o. female who presents today for her Complete Annual Exam. She feels well. She reports exercising walking. She reports she is sleeping fairly well. No breast issues.  Mammogram 05/2019 Colonoscopy  2007 - aged out Immunizations - UTD, flu due DEXA  10/2010  Hypertension This is a chronic problem. The problem is controlled (BP normal at home). Pertinent negatives include no chest pain, headaches, palpitations or shortness of breath. Past treatments include angiotensin blockers. The current treatment provides significant improvement.  Hyperlipidemia The problem is controlled. Pertinent negatives include no chest pain or shortness of breath. Current antihyperlipidemic treatment includes statins. The current treatment provides significant improvement of lipids. There are no compliance problems.   Depression        This is a chronic problem.The problem is unchanged.  Associated symptoms include no fatigue and no headaches.  Past treatments include SSRIs - Selective serotonin reuptake inhibitors and other medications.  Compliance with treatment is good.  Previous treatment provided significant relief. She is mostly worried about her husband Darnell Level who is wheelchair bound but stable.  Lab Results  Component Value Date   CREATININE 1.17 (H) 08/19/2018   BUN 19 08/19/2018   NA 141 08/19/2018   K 4.7 08/19/2018   CL 100 08/19/2018   CO2 26 08/19/2018   Lab Results  Component Value Date   CHOL 159 01/29/2018   HDL 62 01/29/2018   LDLCALC 78 01/29/2018   TRIG 97 01/29/2018   CHOLHDL 2.6 01/29/2018   Lab Results  Component Value Date   TSH 0.961 01/29/2018   No results found for: HGBA1C   Review of Systems  Constitutional: Negative for chills, fatigue and fever.  HENT: Positive for hearing loss. Negative for  congestion, tinnitus, trouble swallowing and voice change.   Eyes: Negative for visual disturbance.  Respiratory: Negative for cough, chest tightness, shortness of breath and wheezing.   Cardiovascular: Negative for chest pain, palpitations and leg swelling.  Gastrointestinal: Negative for abdominal pain, constipation, diarrhea and vomiting.  Endocrine: Negative for polydipsia and polyuria.  Genitourinary: Negative for dysuria, frequency, genital sores, vaginal bleeding and vaginal discharge.  Musculoskeletal: Negative for arthralgias, gait problem and joint swelling.  Skin: Negative for color change and rash.  Neurological: Negative for dizziness, tremors, light-headedness and headaches.  Hematological: Negative for adenopathy. Does not bruise/bleed easily.  Psychiatric/Behavioral: Positive for depression. Negative for dysphoric mood and sleep disturbance. The patient is not nervous/anxious.     Patient Active Problem List   Diagnosis Date Noted  . Lumbar spondylosis 03/27/2016  . Vitamin D deficiency 08/17/2015  . Essential hypertension 07/07/2015  . Major depressive disorder with single episode, in partial remission ([place]) 07/07/2015  . Chronic renal disease, stage III ([place]) 06/26/2015  . Hx of peptic ulcer 06/26/2015  . Generalized psoriasis 06/26/2015  . Stress incontinence, female 06/26/2015  . Dyslipidemia 06/26/2015  . Generalized anxiety disorder 06/26/2015  . Atypical hyperplasia of breast 11/12/2013  . Microcalcifications of the breast     Allergies  Allergen Reactions  . Codeine Itching and Other (See Comments)    hallucinations    Past Surgical History:  Procedure Laterality Date  . BREAST EXCISIONAL BIOPSY Right 2013   The patient's Baker Janus model risk for breast cancer is 1.4% for the next  5 years, 4.4% lifetime    Social History   Tobacco Use  . Smoking status: Never Smoker  . Smokeless tobacco: Never Used  Substance Use Topics  . Alcohol use: No     Alcohol/week: 0.0 standard drinks  . Drug use: No     Medication list has been reviewed and updated.  Current Meds  Medication Sig  . acetaminophen (TYLENOL) 325 MG tablet Take 650 mg by mouth every 6 (six) hours as needed.  . ALPRAZolam (XANAX) 0.25 MG tablet Take 1 tablet (0.25 mg total) by mouth 2 (two) times daily as needed. for anxiety  . betamethasone dipropionate (DIPROLENE) 0.05 % cream Apply topically 2 (two) times daily.  . cholecalciferol (VITAMIN D) 1000 UNITS tablet Take 1,000 Units by mouth daily.  . clotrimazole-betamethasone (LOTRISONE) cream APPLY TO AFFECTED AREA TWICE A DAY AS DIRECTED BY PHYSICIAN  . fluticasone (FLONASE) 50 MCG/ACT nasal spray Place 2 sprays into both nostrils daily.  Marland Kitchen losartan (COZAAR) 100 MG tablet Take 1 tablet (100 mg total) by mouth daily.  . mirtazapine (REMERON) 15 MG tablet TAKE ONE TABLET BY MOUTH AT BEDTIME  . omeprazole (PRILOSEC) 20 MG capsule TAKE ONE CAPSULE BY MOUTH TWICE DAILY  . sertraline (ZOLOFT) 100 MG tablet Take 1 tablet (100 mg total) by mouth daily.  . simvastatin (ZOCOR) 20 MG tablet Take 1 tablet (20 mg total) by mouth every evening.  . traMADol (ULTRAM) 50 MG tablet TAKE ONE TABLET BY MOUTH EVERY 6 HOURS AS NEEDED  . triamterene-hydrochlorothiazide (MAXZIDE-25) 37.5-25 MG tablet Take 1 tablet by mouth daily.    PHQ 2/9 Scores 07/04/2019 08/19/2018 01/29/2018 08/02/2017  PHQ - 2 Score 2 2 0 4  PHQ- 9 Score 2 2 7 9     BP Readings from Last 3 Encounters:  07/04/19 136/80  08/19/18 114/78  01/29/18 (!) 112/50    Physical Exam Vitals signs and nursing note reviewed.  Constitutional:      General: She is not in acute distress.    Appearance: Normal appearance. She is well-developed.  HENT:     Head: Normocephalic and atraumatic.     Right Ear: Tympanic membrane and ear canal normal.     Left Ear: Tympanic membrane and ear canal normal.     Nose:     Right Sinus: No maxillary sinus tenderness.     Left Sinus: No  maxillary sinus tenderness.  Eyes:     General: No scleral icterus.       Right eye: No discharge.        Left eye: No discharge.     Conjunctiva/sclera: Conjunctivae normal.  Neck:     Musculoskeletal: Normal range of motion. No erythema.     Thyroid: No thyromegaly.     Vascular: No carotid bruit.  Cardiovascular:     Rate and Rhythm: Normal rate and regular rhythm.     Pulses: Normal pulses.     Heart sounds: Normal heart sounds.  Pulmonary:     Effort: Pulmonary effort is normal. No respiratory distress.     Breath sounds: No wheezing.  Chest:     Breasts:        Right: No mass, nipple discharge, skin change or tenderness.        Left: No mass, nipple discharge, skin change or tenderness.  Abdominal:     General: Bowel sounds are normal.     Palpations: Abdomen is soft.     Tenderness: There is no abdominal tenderness.  Musculoskeletal: Normal  range of motion.     Right lower leg: No edema.     Left lower leg: No edema.  Lymphadenopathy:     Cervical: No cervical adenopathy.  Skin:    General: Skin is warm and dry.     Capillary Refill: Capillary refill takes less than 2 seconds.     Findings: No lesion or rash.  Neurological:     General: No focal deficit present.     Mental Status: She is alert and oriented to person, place, and time.     Cranial Nerves: No cranial nerve deficit.     Sensory: No sensory deficit.     Deep Tendon Reflexes: Reflexes are normal and symmetric.  Psychiatric:        Attention and Perception: Attention normal.        Mood and Affect: Mood normal.        Speech: Speech normal.        Behavior: Behavior normal.        Thought Content: Thought content normal. Thought content does not include suicidal ideation. Thought content does not include suicidal plan.        Cognition and Memory: Cognition normal.     Wt Readings from Last 3 Encounters:  07/04/19 150 lb (68 kg)  08/19/18 142 lb (64.4 kg)  01/29/18 144 lb (65.3 kg)    BP 136/80    Pulse 78   Ht 5\' 4"  (1.626 m)   Wt 150 lb (68 kg)   SpO2 94%   BMI 25.75 kg/m   Assessment and Plan: 1. Annual physical exam Normal exam Continue healthy diet, regular exercise  2. Essential hypertension Clinically stable exam with well controlled BP.   Tolerating medications, losartan 100 mg and Maxzide 37.5 mg, without side effects at this time. Pt to continue current regimen and low sodium diet; benefits of regular exercise as able discussed. - CBC with Differential/Platelet - Comprehensive metabolic panel - TSH  3. Major depressive disorder with single episode, in partial remission (H[facility]) Clinically stable and doing well - no side effects to medication No SI/HI Will continue Zoloft 100 mg and Remeron 15 mg  4. Dyslipidemia Tolerating statin medication without side effects at this time Continue same therapy without change at this time. - Lipid panel   Partially dictated using Editor, commissioning. Any errors are unintentional.  Halina Maidens, MD [facility]  07/04/2019

## 2019-07-05 LAB — COMPREHENSIVE METABOLIC PANEL
ALT: 6 IU/L (ref 0–32)
AST: 15 IU/L (ref 0–40)
Albumin/Globulin Ratio: 1.6 (ref 1.2–2.2)
Albumin: 4.2 g/dL (ref 3.7–4.7)
Alkaline Phosphatase: 85 IU/L (ref 39–117)
BUN/Creatinine Ratio: 23 (ref 12–28)
BUN: 31 mg/dL — ABNORMAL HIGH (ref 8–27)
Bilirubin Total: 0.2 mg/dL (ref 0.0–1.2)
CO2: 24 mmol/L (ref 20–29)
Calcium: 9.4 mg/dL (ref 8.7–10.3)
Chloride: 100 mmol/L (ref 96–106)
Creatinine, Ser: 1.32 mg/dL — ABNORMAL HIGH (ref 0.57–1.00)
GFR calc Af Amer: 45 mL/min/{1.73_m2} — ABNORMAL LOW (ref >59)
GFR calc non Af Amer: 39 mL/min/{1.73_m2} — ABNORMAL LOW (ref >59)
Globulin, Total: 2.7 g/dL (ref 1.5–4.5)
Glucose: 79 mg/dL (ref 65–99)
Potassium: 4 mmol/L (ref 3.5–5.2)
Sodium: 141 mmol/L (ref 134–144)
Total Protein: 6.9 g/dL (ref 6.0–8.5)

## 2019-07-05 LAB — CBC WITH DIFFERENTIAL/PLATELET
Basophils Absolute: 0.1 10*3/uL (ref 0.0–0.2)
Basos: 1 %
EOS (ABSOLUTE): 0.3 10*3/uL (ref 0.0–0.4)
Eos: 4 %
Hematocrit: 41.4 % (ref 34.0–46.6)
Hemoglobin: 14 g/dL (ref 11.1–15.9)
Immature Grans (Abs): 0 10*3/uL (ref 0.0–0.1)
Immature Granulocytes: 0 %
Lymphocytes Absolute: 2.2 10*3/uL (ref 0.7–3.1)
Lymphs: 26 %
MCH: 28.7 pg (ref 26.6–33.0)
MCHC: 33.8 g/dL (ref 31.5–35.7)
MCV: 85 fL (ref 79–97)
Monocytes Absolute: 0.7 10*3/uL (ref 0.1–0.9)
Monocytes: 9 %
Neutrophils Absolute: 5.1 10*3/uL (ref 1.4–7.0)
Neutrophils: 60 %
Platelets: 248 10*3/uL (ref 150–450)
RBC: 4.88 x10E6/uL (ref 3.77–5.28)
RDW: 12.4 % (ref 11.7–15.4)
WBC: 8.4 10*3/uL (ref 3.4–10.8)

## 2019-07-05 LAB — LIPID PANEL
Chol/HDL Ratio: 2.8 ratio (ref 0.0–4.4)
Cholesterol, Total: 160 mg/dL (ref 100–199)
HDL: 57 mg/dL (ref >39)
LDL Chol Calc (NIH): 89 mg/dL (ref 0–99)
Triglycerides: 74 mg/dL (ref 0–149)
VLDL Cholesterol Cal: 14 mg/dL (ref 5–40)

## 2019-07-05 LAB — TSH: TSH: 2.62 u[IU]/mL (ref 0.450–4.500)

## 2019-07-18 ENCOUNTER — Other Ambulatory Visit: Payer: Self-pay | Admitting: Internal Medicine

## 2019-07-18 DIAGNOSIS — I10 Essential (primary) hypertension: Secondary | ICD-10-CM

## 2019-07-31 ENCOUNTER — Other Ambulatory Visit: Payer: Self-pay | Admitting: Internal Medicine

## 2019-07-31 DIAGNOSIS — F411 Generalized anxiety disorder: Secondary | ICD-10-CM

## 2019-08-21 ENCOUNTER — Other Ambulatory Visit: Payer: Self-pay | Admitting: Internal Medicine

## 2019-08-23 ENCOUNTER — Other Ambulatory Visit: Payer: Self-pay | Admitting: Internal Medicine

## 2019-08-23 DIAGNOSIS — F324 Major depressive disorder, single episode, in partial remission: Secondary | ICD-10-CM

## 2019-08-23 DIAGNOSIS — I10 Essential (primary) hypertension: Secondary | ICD-10-CM

## 2019-09-29 ENCOUNTER — Other Ambulatory Visit: Payer: Self-pay | Admitting: Internal Medicine

## 2019-09-29 DIAGNOSIS — F324 Major depressive disorder, single episode, in partial remission: Secondary | ICD-10-CM

## 2019-09-29 MED ORDER — MIRTAZAPINE 15 MG PO TABS: 15.0000 mg | ORAL_TABLET | Freq: Every day | ORAL | 5 refills | Status: DC

## 2019-10-04 ENCOUNTER — Other Ambulatory Visit: Payer: Self-pay | Admitting: Internal Medicine

## 2019-10-04 DIAGNOSIS — M47816 Spondylosis without myelopathy or radiculopathy, lumbar region: Secondary | ICD-10-CM

## 2019-11-14 ENCOUNTER — Other Ambulatory Visit: Payer: Self-pay | Admitting: Internal Medicine

## 2019-11-14 DIAGNOSIS — F411 Generalized anxiety disorder: Secondary | ICD-10-CM

## 2019-12-06 ENCOUNTER — Other Ambulatory Visit: Payer: Self-pay | Admitting: Internal Medicine

## 2019-12-06 DIAGNOSIS — F411 Generalized anxiety disorder: Secondary | ICD-10-CM

## 2019-12-06 NOTE — Telephone Encounter (Signed)
Requested medication (s) are due for refill today: yes  Requested medication (s) are on the active medication list: yes  Last refill:  11/14/19  Future visit scheduled: yes  Notes to clinic:  Medication not delegated to NT to refill   Requested Prescriptions  Pending Prescriptions Disp Refills   ALPRAZolam (XANAX) 0.25 MG tablet [Pharmacy Med Name: alprazolam 0.25 mg tablet] 45 tablet 0    Sig: TAKE ONE TABLET BY MOUTH TWICE DAILY AS NEEDED FOR ANXIETY      Not Delegated - Psychiatry:  Anxiolytics/Hypnotics Failed - 12/06/2019  8:01 AM      Failed - This refill cannot be delegated      Failed - Urine Drug Screen completed in last 360 days.      Passed - Valid encounter within last 6 months    Recent Outpatient Visits           5 months ago Annual physical exam   [facility] Glean Hess, MD   1 year ago Chronic renal disease, stage III [facility])   [facility] Glean Hess, MD   1 year ago Annual physical exam   [facility] Glean Hess, MD   2 years ago Essential hypertension   [facility] Glean Hess, MD   2 years ago Medicare annual wellness visit, subsequent   [facility] Glean Hess, MD       Future Appointments             In 3 weeks Army Melia Jesse Sans, MD [facility], [facility]

## 2019-12-31 ENCOUNTER — Ambulatory Visit: Payer: BC Managed Care – PPO | Admitting: Internal Medicine

## 2019-12-31 ENCOUNTER — Ambulatory Visit (INDEPENDENT_AMBULATORY_CARE_PROVIDER_SITE_OTHER): Payer: PPO

## 2019-12-31 DIAGNOSIS — Z0000 Encounter for general adult medical examination without abnormal findings: Secondary | ICD-10-CM | POA: Diagnosis not present

## 2019-12-31 NOTE — Progress Notes (Signed)
Subjective:   Veronica Rowe is a 79 y.o. female who presents for Medicare Annual (Subsequent) preventive examination.  Virtual Visit via Telephone Note  I connected with  Orlin Hilding on 12/31/19 at 11:20 AM EDT by telephone and verified that I am speaking with the correct person using two identifiers.  Medicare Annual Wellness visit completed telephonically due to Covid-19 pandemic.   Location: Patient: home Provider: office   I discussed the limitations, risks, security and privacy concerns of performing an evaluation and management service by telephone and the availability of in person appointments. The patient expressed understanding and agreed to proceed.  Unable to perform video visit due to patient does not have video capability.   Some vital signs may be absent or patient reported.   Clemetine Marker, LPN    Review of Systems:   Cardiac Risk Factors include: advanced age (>7men, >68 women);dyslipidemia;hypertension     Objective:     Vitals: There were no vitals taken for this visit.  There is no height or weight on file to calculate BMI.  Advanced Directives 12/31/2019 01/04/2016  Does Patient Have a Medical Advance Directive? No No  Would patient like information on creating a medical advance directive? No - Patient declined -    Tobacco Social History   Tobacco Use  Smoking Status Never Smoker  Smokeless Tobacco Never Used     Counseling given: Not Answered   Clinical Intake:  Pre-visit preparation completed: Yes  Pain : No/denies pain     Nutritional Risks: None Diabetes: No  How often do you need to have someone help you when you read instructions, pamphlets, or other written materials from your doctor or pharmacy?: 1 - Never  Interpreter Needed?: No  Information entered by :: Clemetine Marker LPN  Past Medical History:  Diagnosis Date  . AC (acromioclavicular) joint bone spurs   . Arthritis 1999  . Atypical ductal hyperplasia of breast  12/2011  . Chronic kidney disease   . DDD (degenerative disc disease), lumbar   . Depression   . GERD (gastroesophageal reflux disease)   . Hyperlipidemia   . Hypertension 1993  . Microcalcifications of the breast 2013  . Postmenopausal   . Stomach ulcer    Past Surgical History:  Procedure Laterality Date  . BREAST EXCISIONAL BIOPSY Right 2013   The patient's Baker Janus model risk for breast cancer is 1.4% for the next 5 years, 4.4% lifetime   Family History  Problem Relation Age of Onset  . Prostate cancer Maternal Grandfather   . Lung cancer Father   . Cirrhosis Paternal Uncle   . Breast cancer Neg Hx    Social History   Socioeconomic History  . Marital status: Married    Spouse name: Not on file  . Number of children: 1  . Years of education: Not on file  . Highest education level: Not on file  Occupational History  . Not on file  Tobacco Use  . Smoking status: Never Smoker  . Smokeless tobacco: Never Used  Substance and Sexual Activity  . Alcohol use: No    Alcohol/week: 0.0 standard drinks  . Drug use: No  . Sexual activity: Not on file  Other Topics Concern  . Not on file  Social History Narrative  . Not on file   Social Determinants of Health   Financial Resource Strain: Low Risk   . Difficulty of Paying Living Expenses: Not hard at all  Food Insecurity: No Food Insecurity  .  Worried About Charity fundraiser in the Last Year: Never true  . Ran Out of Food in the Last Year: Never true  Transportation Needs: No Transportation Needs  . Lack of Transportation (Medical): No  . Lack of Transportation (Non-Medical): No  Physical Activity: Inactive  . Days of Exercise per Week: 0 days  . Minutes of Exercise per Session: 0 min  Stress: Stress Concern Present  . Feeling of Stress : To some extent  Social Connections: Unknown  . Frequency of Communication with Friends and Family: Patient refused  . Frequency of Social Gatherings with Friends and Family: Patient  refused  . Attends Religious Services: Patient refused  . Active Member of Clubs or Organizations: Patient refused  . Attends Archivist Meetings: Patient refused  . Marital Status: Married    Outpatient Encounter Medications as of 12/31/2019  Medication Sig  . acetaminophen (TYLENOL) 325 MG tablet Take 650 mg by mouth every 6 (six) hours as needed.  . ALPRAZolam (XANAX) 0.25 MG tablet TAKE ONE TABLET BY MOUTH TWICE DAILY AS NEEDED FOR ANXIETY  . betamethasone dipropionate (DIPROLENE) 0.05 % cream Apply topically 2 (two) times daily.  . cholecalciferol (VITAMIN D) 1000 UNITS tablet Take 1,000 Units by mouth daily.  . fluticasone (FLONASE) 50 MCG/ACT nasal spray Place 2 sprays into both nostrils daily.  Marland Kitchen losartan (COZAAR) 100 MG tablet TAKE ONE TABLET BY MOUTH ONCE DAILY  . mirtazapine (REMERON) 15 MG tablet Take 1 tablet (15 mg total) by mouth at bedtime.  Marland Kitchen omeprazole (PRILOSEC) 20 MG capsule TAKE ONE CAPSULE BY MOUTH TWICE DAILY  . sertraline (ZOLOFT) 100 MG tablet TAKE ONE TABLET BY MOUTH ONCE DAILY  . simvastatin (ZOCOR) 20 MG tablet TAKE ONE TABLET BY MOUTH EVERY EVENING  . traMADol (ULTRAM) 50 MG tablet TAKE ONE TABLET BY MOUTH EVERY 6 HOURS AS NEEDED  . triamterene-hydrochlorothiazide (MAXZIDE-25) 37.5-25 MG tablet TAKE ONE TABLET BY MOUTH ONCE DAILY  . [DISCONTINUED] clotrimazole-betamethasone (LOTRISONE) cream APPLY TOPICALLY TO AFFECTED AREA TWICE A DAY AS DIRECTED PER MD   No facility-administered encounter medications on file as of 12/31/2019.    Activities of Daily Living In your present state of health, do you have any difficulty performing the following activities: 12/31/2019  Hearing? Y  Comment declines hearing aids  Vision? N  Difficulty concentrating or making decisions? N  Walking or climbing stairs? N  Dressing or bathing? N  Doing errands, shopping? N  Preparing Food and eating ? N  Using the Toilet? N  In the past six months, have you accidently  leaked urine? Y  Comment wears pads for protection  Do you have problems with loss of bowel control? N  Managing your Medications? N  Managing your Finances? N  Housekeeping or managing your Housekeeping? N  Some recent data might be hidden    Patient Care Team: Glean Hess, MD as PCP - General (Internal Medicine) Bary Castilla Forest Gleason, MD (General Surgery)    Assessment:   This is a routine wellness examination for Takayla.  Exercise Activities and Dietary recommendations Current Exercise Habits: The patient does not participate in regular exercise at present, Exercise limited by: None identified  Goals    . DIET - INCREASE WATER INTAKE     Recommend drinking 6-8 glasses of water per day       Fall Risk Fall Risk  12/31/2019 08/19/2018 01/29/2018 01/16/2017 01/04/2016  Falls in the past year? 0 0 Yes Yes No  Number falls in  past yr: 0 0 2 or more 2 or more -  Injury with Fall? 0 0 No No -  Risk Factor Category  - - High Fall Risk High Fall Risk -  Risk for fall due to : No Fall Risks - - - -  Follow up Falls prevention discussed Falls evaluation completed Falls evaluation completed - -   FALL RISK PREVENTION PERTAINING TO THE HOME:  Any stairs in or around the home? Yes  If so, do they handrails? Yes   Home free of loose throw rugs in walkways, pet beds, electrical cords, etc? Yes  Adequate lighting in your home to reduce risk of falls? Yes   ASSISTIVE DEVICES UTILIZED TO PREVENT FALLS:  Life alert? No  Use of a cane, walker or w/c? No  Grab bars in the bathroom? Yes  Shower chair or bench in shower? No  Elevated toilet seat or a handicapped toilet? No   DME ORDERS:  DME order needed?  No   TIMED UP AND GO:  Was the test performed? No . Telephonic visit.  Education: Fall risk prevention has been discussed.  Intervention(s) required? No    Depression Screen PHQ 2/9 Scores 12/31/2019 07/04/2019 08/19/2018 01/29/2018  PHQ - 2 Score 0 2 2 0  PHQ- 9 Score - 2 2 7        Cognitive Function     6CIT Screen 12/31/2019 01/16/2017  What Year? 0 points 0 points  What month? 0 points 0 points  What time? 0 points 0 points  Count back from 20 0 points 0 points  Months in reverse 0 points 0 points  Repeat phrase 10 points 0 points  Total Score 10 0    Immunization History  Administered Date(s) Administered  . Fluad Quad(high Dose 65+) 07/04/2019  . Pneumococcal Conjugate-13 01/04/2016  . Pneumococcal Polysaccharide-23 10/05/2011  . Tdap 10/28/2013    Qualifies for Shingles Vaccine? Yes . Due for Shingrix. Education has been provided regarding the importance of this vaccine. Pt has been advised to call insurance company to determine out of pocket expense. Advised may also receive vaccine at local pharmacy or Health Dept. Verbalized acceptance and understanding.  Tdap: Up to date  Flu Vaccine: Up to date  Pneumococcal Vaccine: Up to date  Covid-19 Vaccine: Due for Covid-19 vaccine. Education has been provided regarding the importance of this vaccine. Advised may receive this vaccine at local pharmacy, health department or [place] vaccine clinic. Aware to provide a copy of the vaccination record if obtained from local pharmacy or health department. Verbalized acceptance and understanding.    Screening Tests Health Maintenance  Topic Date Due  . COVID-19 Vaccine (1) 01/16/2020 (Originally 04/03/1957)  . INFLUENZA VACCINE  03/14/2020  . MAMMOGRAM  05/29/2020  . TETANUS/TDAP  10/29/2023  . PNA vac Low Risk Adult  Completed  . DEXA SCAN  Addressed    Cancer Screenings:  Colorectal Screening: No longer required.   Mammogram: Completed 06/03/19. Repeat every year;   Bone Density: Completed 10/17/10. Results reflect NORMAL. Pt declines repeat screening.   Lung Cancer Screening: (Low Dose CT Chest recommended if Age 79-80 years, 30 pack-year currently smoking OR have quit w/in 15years.) does not qualify.   Additional Screening:  Hepatitis C  Screening: no longer required  Vision Screening: Recommended annual ophthalmology exams for early detection of glaucoma and other disorders of the eye. Is the patient up to date with their annual eye exam?  No  Who is the provider or  what is the name of the office in which the pt attends annual eye exams? [facility]  Dental Screening: Recommended annual dental exams for proper oral hygiene  Community Resource Referral:  CRR required this visit?  No      Plan:     I have personally reviewed and addressed the Medicare Annual Wellness questionnaire and have noted the following in the patient's chart:  A. Medical and social history B. Use of alcohol, tobacco or illicit drugs  C. Current medications and supplements D. Functional ability and status E.  Nutritional status F.  Physical activity G. Advance directives H. List of other physicians I.  Hospitalizations, surgeries, and ER visits in previous 12 months J.  [place] such as hearing and vision if needed, cognitive and depression L. Referrals and appointments   In addition, I have reviewed and discussed with patient certain preventive protocols, quality metrics, and best practice recommendations. A written personalized care plan for preventive services as well as general preventive health recommendations were provided to patient.   Signed,  Clemetine Marker, LPN Nurse Health Advisor   Nurse Notes:

## 2019-12-31 NOTE — Patient Instructions (Signed)
[name] , Thank you for taking time to come for your Medicare Wellness Visit. I appreciate your ongoing commitment to your health goals. Please review the following plan we discussed and let me know if I can assist you in the future.   Screening recommendations/referrals: Colonoscopy: no longer required Mammogram: done 06/03/19 Bone Density: done 10/17/10 Recommended yearly ophthalmology/optometry visit for glaucoma screening and checkup Recommended yearly dental visit for hygiene and checkup  Vaccinations: Influenza vaccine: done 07/04/19 Pneumococcal vaccine: done 01/04/16 Tdap vaccine: done 10/28/13 Shingles vaccine: Shingrix discussed. Please contact your pharmacy for coverage information.  Covid-19: discussed  Advanced directives: Advance directive discussed with you today. Even though you declined this today please call our office should you change your mind and we can give you the proper paperwork for you to fill out.  Conditions/risks identified: Recommend drinking 6-8 glasses of water per day  Next appointment: Please follow up in one year for your Medicare Annual Wellness visit.     Preventive Care 65 Years and Older, Female \\Preventive  care refers to lifestyle choices and visits with your health care provider that can promote health and wellness. What does preventive care include?  A yearly physical exam. This is also called an annual well check.  Dental exams once or twice a year.  Routine eye exams. Ask your health care provider how often you should have your eyes checked.  Personal lifestyle choices, including:  Daily care of your teeth and gums.  Regular physical activity.  Eating a healthy diet.  Avoiding tobacco and drug use.  Limiting alcohol use.  Practicing safe sex.  Taking low-dose aspirin every day.  Taking vitamin and mineral supplements as recommended by your health care provider. What happens during an annual well check? The services and  screenings done by your health care provider during your annual well check will depend on your age, overall health, lifestyle risk factors, and family history of disease. Counseling  Your health care provider may ask you questions about your:  Alcohol use.  Tobacco use.  Drug use.  Emotional well-being.  Home and relationship well-being.  Sexual activity.  Eating habits.  History of falls.  Memory and ability to understand (cognition).  Work and work Statistician.  Reproductive health. Screening  You may have the following tests or measurements:  Height, weight, and BMI.  Blood pressure.  Lipid and cholesterol levels. These may be checked every 5 years, or more frequently if you are over 41 years old.  Skin check.  Lung cancer screening. You may have this screening every year starting at age 6 if you have a 30-pack-year history of smoking and currently smoke or have quit within the past 15 years.  Fecal occult blood test (FOBT) of the stool. You may have this test every year starting at age 57.  Flexible sigmoidoscopy or colonoscopy. You may have a sigmoidoscopy every 5 years or a colonoscopy every 10 years starting at age 15.  Hepatitis C blood test.  Hepatitis B blood test.  Sexually transmitted disease (STD) testing.  Diabetes screening. This is done by checking your blood sugar (glucose) after you have not eaten for a while (fasting). You may have this done every 1-3 years.  Bone density scan. This is done to screen for osteoporosis. You may have this done starting at age 31.  Mammogram. This may be done every 1-2 years. Talk to your health care provider about how often you should have regular mammograms. Talk with your health care provider  about your test results, treatment options, and if necessary, the need for more tests. Vaccines  Your health care provider may recommend certain vaccines, such as:  Influenza vaccine. This is recommended every  year.  Tetanus, diphtheria, and acellular pertussis (Tdap, Td) vaccine. You may need a Td booster every 10 years.  Zoster vaccine. You may need this after age 58.  Pneumococcal 13-valent conjugate (PCV13) vaccine. One dose is recommended after age 75.  Pneumococcal polysaccharide (PPSV23) vaccine. One dose is recommended after age 67. Talk to your health care provider about which screenings and vaccines you need and how often you need them. This information is not intended to replace advice given to you by your health care provider. Make sure you discuss any questions you have with your health care provider. Document Released: 08/27/2015 Document Revised: 04/19/2016 Document Reviewed: 06/01/2015 Elsevier Interactive Patient Education  2017 [employer] Prevention in the Home Falls can cause injuries. They can happen to people of all ages. There are many things you can do to make your home safe and to help prevent falls. What can I do on the outside of my home?  Regularly fix the edges of walkways and driveways and fix any cracks.  Remove anything that might make you trip as you walk through a door, such as a raised step or threshold.  Trim any bushes or trees on the path to your home.  Use bright outdoor lighting.  Clear any walking paths of anything that might make someone trip, such as rocks or tools.  Regularly check to see if handrails are loose or broken. Make sure that both sides of any steps have handrails.  Any raised decks and porches should have guardrails on the edges.  Have any leaves, snow, or ice cleared regularly.  Use sand or salt on walking paths during winter.  Clean up any spills in your garage right away. This includes oil or grease spills. What can I do in the bathroom?  Use night lights.  Install grab bars by the toilet and in the tub and shower. Do not use towel bars as grab bars.  Use non-skid mats or decals in the tub or shower.  If you  need to sit down in the shower, use a plastic, non-slip stool.  Keep the floor dry. Clean up any water that spills on the floor as soon as it happens.  Remove soap buildup in the tub or shower regularly.  Attach bath mats securely with double-sided non-slip rug tape.  Do not have throw rugs and other things on the floor that can make you trip. What can I do in the bedroom?  Use night lights.  Make sure that you have a light by your bed that is easy to reach.  Do not use any sheets or blankets that are too big for your bed. They should not hang down onto the floor.  Have a firm chair that has side arms. You can use this for support while you get dressed.  Do not have throw rugs and other things on the floor that can make you trip. What can I do in the kitchen?  Clean up any spills right away.  Avoid walking on wet floors.  Keep items that you use a lot in easy-to-reach places.  If you need to reach something above you, use a strong step stool that has a grab bar.  Keep electrical cords out of the way.  Do not use floor polish or  wax that makes floors slippery. If you must use wax, use non-skid floor wax.  Do not have throw rugs and other things on the floor that can make you trip. What can I do with my stairs?  Do not leave any items on the stairs.  Make sure that there are handrails on both sides of the stairs and use them. Fix handrails that are broken or loose. Make sure that handrails are as long as the stairways.  Check any carpeting to make sure that it is firmly attached to the stairs. Fix any carpet that is loose or worn.  Avoid having throw rugs at the top or bottom of the stairs. If you do have throw rugs, attach them to the floor with carpet tape.  Make sure that you have a light switch at the top of the stairs and the bottom of the stairs. If you do not have them, ask someone to add them for you. What else can I do to help prevent falls?  Wear shoes  that:  Do not have high heels.  Have rubber bottoms.  Are comfortable and fit you well.  Are closed at the toe. Do not wear sandals.  If you use a stepladder:  Make sure that it is fully opened. Do not climb a closed stepladder.  Make sure that both sides of the stepladder are locked into place.  Ask someone to hold it for you, if possible.  Clearly mark and make sure that you can see:  Any grab bars or handrails.  First and last steps.  Where the edge of each step is.  Use tools that help you move around (mobility aids) if they are needed. These include:  Canes.  Walkers.  Scooters.  Crutches.  Turn on the lights when you go into a dark area. Replace any light bulbs as soon as they burn out.  Set up your furniture so you have a clear path. Avoid moving your furniture around.  If any of your floors are uneven, fix them.  If there are any pets around you, be aware of where they are.  Review your medicines with your doctor. Some medicines can make you feel dizzy. This can increase your chance of falling. Ask your doctor what other things that you can do to help prevent falls. This information is not intended to replace advice given to you by your health care provider. Make sure you discuss any questions you have with your health care provider. Document Released: 05/27/2009 Document Revised: 01/06/2016 Document Reviewed: 09/04/2014 Elsevier Interactive Patient Education  2017 [employer].

## 2020-01-02 ENCOUNTER — Other Ambulatory Visit: Payer: Self-pay

## 2020-01-02 ENCOUNTER — Encounter: Payer: Self-pay | Admitting: Internal Medicine

## 2020-01-02 ENCOUNTER — Ambulatory Visit (INDEPENDENT_AMBULATORY_CARE_PROVIDER_SITE_OTHER): Payer: PPO | Admitting: Internal Medicine

## 2020-01-02 VITALS — BP 104/62 | HR 89 | Temp 98.6°F | Ht 64.0 in | Wt 144.0 lb

## 2020-01-02 DIAGNOSIS — E785 Hyperlipidemia, unspecified: Secondary | ICD-10-CM | POA: Diagnosis not present

## 2020-01-02 DIAGNOSIS — F324 Major depressive disorder, single episode, in partial remission: Secondary | ICD-10-CM | POA: Diagnosis not present

## 2020-01-02 DIAGNOSIS — M19041 Primary osteoarthritis, right hand: Secondary | ICD-10-CM | POA: Diagnosis not present

## 2020-01-02 DIAGNOSIS — M19042 Primary osteoarthritis, left hand: Secondary | ICD-10-CM

## 2020-01-02 DIAGNOSIS — I10 Essential (primary) hypertension: Secondary | ICD-10-CM | POA: Diagnosis not present

## 2020-01-02 DIAGNOSIS — N183 Chronic kidney disease, stage 3 unspecified: Secondary | ICD-10-CM | POA: Diagnosis not present

## 2020-01-02 NOTE — Progress Notes (Signed)
Date:  01/02/2020   Name:  Veronica Rowe   DOB:  January 19, 1941   MRN:  [id number]   Chief Complaint: Hypertension (6 month follow up. )  Hypertension Pertinent negatives include no chest pain, headaches, palpitations or shortness of breath. Past treatments include angiotensin blockers and diuretics. The current treatment provides significant improvement. Hypertensive end-organ damage includes kidney disease.  Depression        This is a chronic problem.The problem is unchanged.  Associated symptoms include no fatigue and no headaches.  Past treatments include SSRIs - Selective serotonin reuptake inhibitors and other medications (xanax and remeron).  Compliance with treatment is good. Renal insuff - last seen by Nephrology in 07/2018.  Labs done last year showed stable GFR = 39.  She believes that she was dismissed from Nephrology and only needs to return if there are new issues. Pt advised on good HTN control and avoidance of nsaids.  She takes Tramadol now as needed for severe joint pain rather than tylenol or nsaids.  Immunization History  Administered Date(s) Administered  . Fluad Quad(high Dose 65+) 07/04/2019  . Pneumococcal Conjugate-13 01/04/2016  . Pneumococcal Polysaccharide-23 10/05/2011  . Tdap 10/28/2013    Lab Results  Component Value Date   CREATININE 1.32 (H) 07/04/2019   BUN 31 (H) 07/04/2019   NA 141 07/04/2019   K 4.0 07/04/2019   CL 100 07/04/2019   CO2 24 07/04/2019   Lab Results  Component Value Date   CHOL 160 07/04/2019   HDL 57 07/04/2019   LDLCALC 89 07/04/2019   TRIG 74 07/04/2019   CHOLHDL 2.8 07/04/2019   Lab Results  Component Value Date   TSH 2.620 07/04/2019   No results found for: HGBA1C Lab Results  Component Value Date   WBC 8.4 07/04/2019   HGB 14.0 07/04/2019   HCT 41.4 07/04/2019   MCV 85 07/04/2019   PLT 248 07/04/2019   Lab Results  Component Value Date   ALT 6 07/04/2019   AST 15 07/04/2019   ALKPHOS 85 07/04/2019   BILITOT 0.2 07/04/2019     Review of Systems  Constitutional: Negative for chills, fatigue, fever and unexpected weight change.  Respiratory: Negative for cough, chest tightness, shortness of breath and wheezing.   Cardiovascular: Negative for chest pain and palpitations.  Gastrointestinal: Negative for abdominal pain.  Musculoskeletal: Positive for arthralgias (hands and fingers). Negative for gait problem.  Neurological: Negative for dizziness, light-headedness and headaches.  Psychiatric/Behavioral: Positive for depression. Negative for dysphoric mood and sleep disturbance. The patient is not nervous/anxious.     Patient Active Problem List   Diagnosis Date Noted  . Lumbar spondylosis 03/27/2016  . Vitamin D deficiency 08/17/2015  . Essential hypertension 07/07/2015  . Major depressive disorder with single episode, in partial remission ([place]) 07/07/2015  . Chronic renal disease, stage III ([place]) 06/26/2015  . Hx of peptic ulcer 06/26/2015  . Generalized psoriasis 06/26/2015  . Stress incontinence, female 06/26/2015  . Dyslipidemia 06/26/2015  . Generalized anxiety disorder 06/26/2015  . Atypical hyperplasia of breast 11/12/2013  . Microcalcifications of the breast     Allergies  Allergen Reactions  . Codeine Itching and Other (See Comments)    hallucinations    Past Surgical History:  Procedure Laterality Date  . BREAST EXCISIONAL BIOPSY Right 2013   The patient's Baker Janus model risk for breast cancer is 1.4% for the next 5 years, 4.4% lifetime    Social History   Tobacco Use  . Smoking  status: Never Smoker  . Smokeless tobacco: Never Used  Substance Use Topics  . Alcohol use: No    Alcohol/week: 0.0 standard drinks  . Drug use: No     Medication list has been reviewed and updated.  Current Meds  Medication Sig  . acetaminophen (TYLENOL) 325 MG tablet Take 650 mg by mouth every 6 (six) hours as needed.  . ALPRAZolam (XANAX) 0.25 MG tablet TAKE ONE TABLET BY  MOUTH TWICE DAILY AS NEEDED FOR ANXIETY  . betamethasone dipropionate (DIPROLENE) 0.05 % cream Apply topically 2 (two) times daily.  . cholecalciferol (VITAMIN D) 1000 UNITS tablet Take 1,000 Units by mouth daily.  . fluticasone (FLONASE) 50 MCG/ACT nasal spray Place 2 sprays into both nostrils daily.  Marland Kitchen losartan (COZAAR) 100 MG tablet TAKE ONE TABLET BY MOUTH ONCE DAILY  . mirtazapine (REMERON) 15 MG tablet Take 1 tablet (15 mg total) by mouth at bedtime.  Marland Kitchen omeprazole (PRILOSEC) 20 MG capsule TAKE ONE CAPSULE BY MOUTH TWICE DAILY  . sertraline (ZOLOFT) 100 MG tablet TAKE ONE TABLET BY MOUTH ONCE DAILY  . simvastatin (ZOCOR) 20 MG tablet TAKE ONE TABLET BY MOUTH EVERY EVENING  . traMADol (ULTRAM) 50 MG tablet TAKE ONE TABLET BY MOUTH EVERY 6 HOURS AS NEEDED  . triamterene-hydrochlorothiazide (MAXZIDE-25) 37.5-25 MG tablet TAKE ONE TABLET BY MOUTH ONCE DAILY    PHQ 2/9 Scores 01/02/2020 12/31/2019 07/04/2019 08/19/2018  PHQ - 2 Score 0 0 2 2  PHQ- 9 Score 0 - 2 2    BP Readings from Last 3 Encounters:  01/02/20 104/62  07/04/19 136/80  08/19/18 114/78    Physical Exam Vitals and nursing note reviewed.  Constitutional:      General: She is not in acute distress.    Appearance: She is well-developed.  HENT:     Head: Normocephalic and atraumatic.  Cardiovascular:     Rate and Rhythm: Normal rate and regular rhythm.     Pulses: Normal pulses.     Heart sounds: Normal heart sounds.  Pulmonary:     Effort: Pulmonary effort is normal. No respiratory distress.  Musculoskeletal:        General: Normal range of motion.     Right hand: Deformity present.     Left hand: Deformity present.     Right lower leg: No edema.     Left lower leg: No edema.     Comments: Swelling of second MCP joints  Skin:    General: Skin is warm and dry.     Findings: No rash.  Neurological:     Mental Status: She is alert and oriented to person, place, and time.  Psychiatric:        Attention and  Perception: Attention normal.        Mood and Affect: Mood normal.        Behavior: Behavior normal.        Thought Content: Thought content normal.     Wt Readings from Last 3 Encounters:  01/02/20 144 lb (65.3 kg)  07/04/19 150 lb (68 kg)  08/19/18 142 lb (64.4 kg)    BP 104/62   Pulse 89   Temp 98.6 F (37 C) (Oral)   Ht 5\' 4"  (1.626 m)   Wt 144 lb (65.3 kg)   SpO2 96%   BMI 24.72 kg/m   Assessment and Plan: 1. Essential hypertension Clinically stable exam with well controlled BP on current therapy. Tolerating medications without side effects at this time. Pt to  continue current regimen and low sodium diet; benefits of regular exercise as able discussed.  2. Stage 3 chronic kidney disease, unspecified whether stage 3a or 3b CKD Continue to avoid tylenol and nsaids Use tramadol prn - Basic metabolic panel  3. Major depressive disorder with single episode, in partial remission (H[facility]) Clinically stable on current regimen with good control of symptoms, No SI or HI. Will continue current therapy.  4. Dyslipidemia Tolerating statin therapy  Labs done 6 months ago   Partially dictated using Editor, commissioning. Any errors are unintentional.  Halina Maidens, MD [facility]  01/02/2020

## 2020-01-03 LAB — BASIC METABOLIC PANEL
BUN/Creatinine Ratio: 17 (ref 12–28)
BUN: 30 mg/dL — ABNORMAL HIGH (ref 8–27)
CO2: 24 mmol/L (ref 20–29)
Calcium: 9.9 mg/dL (ref 8.7–10.3)
Chloride: 99 mmol/L (ref 96–106)
Creatinine, Ser: 1.81 mg/dL — ABNORMAL HIGH (ref 0.57–1.00)
GFR calc Af Amer: 31 mL/min/{1.73_m2} — ABNORMAL LOW (ref >59)
GFR calc non Af Amer: 27 mL/min/{1.73_m2} — ABNORMAL LOW (ref >59)
Glucose: 94 mg/dL (ref 65–99)
Potassium: 4.5 mmol/L (ref 3.5–5.2)
Sodium: 139 mmol/L (ref 134–144)

## 2020-01-08 ENCOUNTER — Other Ambulatory Visit: Payer: Self-pay | Admitting: Internal Medicine

## 2020-01-08 DIAGNOSIS — F411 Generalized anxiety disorder: Secondary | ICD-10-CM

## 2020-01-08 NOTE — Telephone Encounter (Signed)
Requested medication (s) are due for refill today:  Yes  Requested medication (s) are on the active medication list:  Yes  Future visit scheduled:  Yes  Last Refill: 12/15/19; #45 / no refills.     Requested Prescriptions  Pending Prescriptions Disp Refills   ALPRAZolam (XANAX) 0.25 MG tablet [Pharmacy Med Name: alprazolam 0.25 mg tablet] 45 tablet 0    Sig: TAKE ONE TABLET BY MOUTH TWICE DAILY AS NEEDED FOR ANXIETY      Not Delegated - Psychiatry:  Anxiolytics/Hypnotics Failed - 01/08/2020 11:56 AM      Failed - This refill cannot be delegated      Failed - Urine Drug Screen completed in last 360 days.      Passed - Valid encounter within last 6 months    Recent Outpatient Visits           6 days ago Essential hypertension   [facility], [facility]   6 months ago Annual physical exam   [facility], MD   1 year ago Chronic renal disease, stage III [facility])   [facility]   1 year ago Annual physical exam   [facility] Glean Hess, MD   2 years ago Essential hypertension   [facility], [facility]       Future Appointments             In 4 months Army Melia Jesse Sans, MD [facility], [place]   In 6 months Army Melia Jesse Sans, MD [facility], [facility]

## 2020-01-31 ENCOUNTER — Other Ambulatory Visit: Payer: Self-pay | Admitting: Internal Medicine

## 2020-01-31 DIAGNOSIS — I10 Essential (primary) hypertension: Secondary | ICD-10-CM

## 2020-01-31 DIAGNOSIS — F324 Major depressive disorder, single episode, in partial remission: Secondary | ICD-10-CM

## 2020-01-31 NOTE — Telephone Encounter (Signed)
Requested Prescriptions  Pending Prescriptions Disp Refills   losartan (COZAAR) 100 MG tablet [Pharmacy Med Name: losartan 100 mg tablet] 90 tablet 1    Sig: TAKE ONE TABLET BY MOUTH ONCE DAILY     Cardiovascular:  Angiotensin Receptor Blockers Failed - 01/31/2020  8:00 AM      Failed - Cr in normal range and within 180 days    Creatinine, Ser  Date Value Ref Range Status  01/02/2020 1.81 (H) 0.57 - 1.00 mg/dL Final         Passed - K in normal range and within 180 days    Potassium  Date Value Ref Range Status  01/02/2020 4.5 3.5 - 5.2 mmol/L Final  12/28/2011 3.4 (L) 3.5 - 5.1 mmol/L Final         Passed - Patient is not pregnant      Passed - Last BP in normal range    BP Readings from Last 1 Encounters:  01/02/20 104/62         Passed - Valid encounter within last 6 months    Recent Outpatient Visits          4 weeks ago Essential hypertension   [facility], [facility]   7 months ago Annual physical exam   [facility], [facility]   1 year ago Chronic renal disease, stage III [facility])   [facility], [facility]   2 years ago Annual physical exam   [facility] Glean Hess, MD   2 years ago Essential hypertension   [facility], [facility]      Future Appointments            In 3 months Glean Hess, MD [facility], [place]   In 5 months Glean Hess, MD [facility], [facility]            triamterene-hydrochlorothiazide (MAXZIDE-25) 37.5-25 MG tablet [Pharmacy Med Name: triamterene 37.5 mg-hydrochlorothiazide 25 mg tablet] 90 tablet 1    Sig: TAKE ONE TABLET BY MOUTH ONCE DAILY     Cardiovascular: Diuretic Combos Failed - 01/31/2020  8:00 AM      Failed - Cr in normal range and within 360 days    Creatinine, Ser  Date Value Ref Range Status  01/02/2020 1.81 (H) 0.57 - 1.00 mg/dL Final         Passed - K in normal range and within 360 days     Potassium  Date Value Ref Range Status  01/02/2020 4.5 3.5 - 5.2 mmol/L Final  12/28/2011 3.4 (L) 3.5 - 5.1 mmol/L Final         Passed - Na in normal range and within 360 days    Sodium  Date Value Ref Range Status  01/02/2020 139 134 - 144 mmol/L Final         Passed - Ca in normal range and within 360 days    Calcium  Date Value Ref Range Status  01/02/2020 9.9 8.7 - 10.3 mg/dL Final         Passed - Last BP in normal range    BP Readings from Last 1 Encounters:  01/02/20 104/62         Passed - Valid encounter within last 6 months    Recent Outpatient Visits          4 weeks ago Essential hypertension   [facility]  H, MD   7 months ago Annual physical exam   [facility] Gle[facility], MD   1 year ago Chronic renal disease, stage III [facility])   [facility]   2 years ago Annual physical exam   [facility] Glean Hess, MD   2 years ago Essential hypertension   [facility] Glean Hess, MD      Future Appointments            In 3 months Army Melia Jesse Sans, MD [facility], [place]   In 5 months Army Melia Jesse Sans, MD [facility], [facility]            sertraline (ZOLOFT) 100 MG tablet [Pharmacy Med Name: sertraline 100 mg tablet] 90 tablet 1    Sig: TAKE ONE TABLET BY MOUTH ONCE DAILY     Psychiatry:  Antidepressants - SSRI Passed - 01/31/2020  8:00 AM      Passed - Completed PHQ-2 or PHQ-9 in the last 360 days.      Passed - Valid encounter within last 6 months    Recent Outpatient Visits          4 weeks ago Essential hypertension   [facility], [facility]   7 months ago Annual physical exam   [facility] Glean Hess, MD   1 year ago Chronic renal disease, stage III [facility])   [facility]   2 years ago Annual physical exam   [facility] Gle[facility], [facility]   2 years ago  Essential hypertension   [facility] Glean Hess, MD      Future Appointments            In 3 months Army Melia Jesse Sans, MD [facility], [place]   In 5 months Army Melia Jesse Sans, MD [facility], [facility]

## 2020-02-05 ENCOUNTER — Other Ambulatory Visit: Payer: Self-pay | Admitting: Internal Medicine

## 2020-02-05 DIAGNOSIS — F411 Generalized anxiety disorder: Secondary | ICD-10-CM

## 2020-02-05 NOTE — Telephone Encounter (Signed)
Requested medication (s) are due for refill today: yes  Requested medication (s) are on the active medication list:yes   Last refill: 01/09/2020   #45  0 refills  Future visit scheduled   yes 9/29/221  Notes to clinic Not delegated  Requested Prescriptions  Pending Prescriptions Disp Refills   ALPRAZolam (XANAX) 0.25 MG tablet [Pharmacy Med Name: alprazolam 0.25 mg tablet] 45 tablet 0    Sig: TAKE ONE TABLET BY MOUTH TWICE DAILY AS NEEDED FOR ANXIETY      Not Delegated - Psychiatry:  Anxiolytics/Hypnotics Failed - 02/05/2020 12:28 PM      Failed - This refill cannot be delegated      Failed - Urine Drug Screen completed in last 360 days.      Passed - Valid encounter within last 6 months    Recent Outpatient Visits           1 month ago Essential hypertension   [facility]   7 months ago Annual physical exam   [facility], [facility]   1 year ago Chronic renal disease, stage III [facility])   [facility], [facility]   2 years ago Annual physical exam   [facility] Glean Hess, MD   2 years ago Essential hypertension   [facility], [facility]       Future Appointments             In 3 months Army Melia Jesse Sans, MD [facility], [place]   In 5 months Army Melia Jesse Sans, MD [facility], [facility]

## 2020-02-05 NOTE — Telephone Encounter (Signed)
Please Advise. Last office visit was 01/02/20.  KP

## 2020-02-21 ENCOUNTER — Other Ambulatory Visit: Payer: Self-pay | Admitting: Internal Medicine

## 2020-02-21 NOTE — Telephone Encounter (Signed)
Requested Prescriptions  Pending Prescriptions Disp Refills  . simvastatin (ZOCOR) 20 MG tablet [Pharmacy Med Name: simvastatin 20 mg tablet] 90 tablet 1    Sig: TAKE ONE TABLET BY MOUTH EVERY EVENING     Cardiovascular:  Antilipid - Statins Failed - 02/21/2020  8:00 AM      Failed - LDL in normal range and within 360 days    LDL Chol Calc (NIH)  Date Value Ref Range Status  07/04/2019 89 0 - 99 mg/dL Final         Passed - Total Cholesterol in normal range and within 360 days    Cholesterol, Total  Date Value Ref Range Status  07/04/2019 160 100 - 199 mg/dL Final         Passed - HDL in normal range and within 360 days    HDL  Date Value Ref Range Status  07/04/2019 57 >39 mg/dL Final         Passed - Triglycerides in normal range and within 360 days    Triglycerides  Date Value Ref Range Status  07/04/2019 74 0 - 149 mg/dL Final         Passed - Patient is not pregnant      Passed - Valid encounter within last 12 months    Recent Outpatient Visits          1 month ago Essential hypertension   [facility], [facility]   7 months ago Annual physical exam   [facility], [facility]   1 year ago Chronic renal disease, stage III [facility])   [facility], [facility]   2 years ago Annual physical exam   [facility] Glean Hess, MD   2 years ago Essential hypertension   [facility], [facility]      Future Appointments            In 2 months Army Melia Jesse Sans, MD [facility], [place]   In 5 months Army Melia Jesse Sans, MD [facility], [facility]

## 2020-03-04 ENCOUNTER — Other Ambulatory Visit: Payer: Self-pay | Admitting: Internal Medicine

## 2020-03-04 DIAGNOSIS — F324 Major depressive disorder, single episode, in partial remission: Secondary | ICD-10-CM

## 2020-03-04 DIAGNOSIS — F411 Generalized anxiety disorder: Secondary | ICD-10-CM

## 2020-03-04 NOTE — Telephone Encounter (Signed)
Pease Advise. Last office visit 01/02/2020.  KP

## 2020-03-04 NOTE — Telephone Encounter (Signed)
Requested medication (s) are due for refill today: Yes  Requested medication (s) are on the active medication list: Yes  Last refill:  02/12/20  Future visit scheduled: Yes  Notes to clinic:  Unable to refill per protocol cannot delegate     Requested Prescriptions  Pending Prescriptions Disp Refills   ALPRAZolam (XANAX) 0.25 MG tablet [Pharmacy Med Name: alprazolam 0.25 mg tablet] 45 tablet 1    Sig: TAKE ONE TABLET BY MOUTH TWICE DAILY AS NEEDED FOR ANXIETY      Not Delegated - Psychiatry:  Anxiolytics/Hypnotics Failed - 03/04/2020  2:20 PM      Failed - This refill cannot be delegated      Failed - Urine Drug Screen completed in last 360 days.      Passed - Valid encounter within last 6 months    Recent Outpatient Visits           2 months ago Essential hypertension   [facility], [facility]   8 months ago Annual physical exam   [facility] Gle[facility], MD   1 year ago Chronic renal disease, stage III [facility])   [facility] Glean [facility], MD   2 years ago Annual physical exam   [facility] Glean Hess, MD   2 years ago Essential hypertension   [place], [place]       Future Appointments             In 2 months Glean Hess, MD [facility], [place]   In 4 months Glean Hess, MD [facility], [facility]             Signed Prescriptions Disp Refills   mirtazapine (REMERON) 15 MG tablet 90 tablet 1    Sig: TAKE ONE TABLET BY MOUTH AT BEDTIME      Psychiatry: Antidepressants - mirtazapine Passed - 03/04/2020  2:20 PM      Passed - AST in normal range and within 360 days    AST  Date Value Ref Range Status  07/04/2019 15 0 - 40 IU/L Final          Passed - ALT in normal range and within 360 days    ALT  Date Value Ref Range Status  07/04/2019 6 0 - 32 IU/L Final          Passed - Triglycerides in normal range and within 360 days    Triglycerides   Date Value Ref Range Status  07/04/2019 74 0 - 149 mg/dL Final          Passed - Total Cholesterol in normal range and within 360 days    Cholesterol, Total  Date Value Ref Range Status  07/04/2019 160 100 - 199 mg/dL Final          Passed - WBC in normal range and within 360 days    WBC  Date Value Ref Range Status  07/04/2019 8.4 3.4 - 10.8 x10E3/uL Final          Passed - Completed PHQ-2 or PHQ-9 in the last 360 days.      Passed - Valid encounter within last 6 months    Recent Outpatient Visits           2 months ago Essential hypertension   [facility], [facility]   8 months ago Annual physical exam   [facility] Halina Maidens  H, MD   1 year ago Chronic renal disease, stage III [facility])   [facility] Glean Hess, MD   2 years ago Annual physical exam   [facility], [facility]   2 years ago Essential hypertension   [facility] Glean Hess, MD       Future Appointments             In 2 months Army Melia Jesse Sans, MD [facility], [place]   In 4 months Army Melia Jesse Sans, MD [facility], [facility]

## 2020-03-10 ENCOUNTER — Other Ambulatory Visit: Payer: Self-pay | Admitting: Internal Medicine

## 2020-03-10 DIAGNOSIS — Z1231 Encounter for screening mammogram for malignant neoplasm of breast: Secondary | ICD-10-CM

## 2020-03-16 ENCOUNTER — Other Ambulatory Visit: Payer: Self-pay | Admitting: Internal Medicine

## 2020-03-16 DIAGNOSIS — F411 Generalized anxiety disorder: Secondary | ICD-10-CM

## 2020-03-16 NOTE — Telephone Encounter (Signed)
Requested medication (s) are due for refill today:   Provider to review  Requested medication (s) are on the active medication list:   Yes  Future visit scheduled:   Yes   Last ordered: Non delegated Refill   Requested Prescriptions  Pending Prescriptions Disp Refills   ALPRAZolam (XANAX) 0.25 MG tablet [Pharmacy Med Name: alprazolam 0.25 mg tablet] 45 tablet 1    Sig: TAKE ONE TABLET BY MOUTH TWICE DAILY AS NEEDED FOR ANXIETY      Not Delegated - Psychiatry:  Anxiolytics/Hypnotics Failed - 03/16/2020 10:34 AM      Failed - This refill cannot be delegated      Failed - Urine Drug Screen completed in last 360 days.      Passed - Valid encounter within last 6 months    Recent Outpatient Visits           2 months ago Essential hypertension   [facility] Gle[facility], MD   8 months ago Annual physical exam   [facility] Glean [facility], MD   1 year ago Chronic renal disease, stage III [facility])   [facility] Glean [facility], MD   2 years ago Annual physical exam   [facility] Gle[facility], MD   2 years ago Essential hypertension   [facility] Glean [facility], MD       Future Appointments             In 1 month Army Melia Jesse Sans, MD [facility], [place]   In 4 months Army Melia Jesse Sans, MD [facility], [facility]

## 2020-04-08 ENCOUNTER — Other Ambulatory Visit: Payer: Self-pay | Admitting: Internal Medicine

## 2020-04-08 DIAGNOSIS — F411 Generalized anxiety disorder: Secondary | ICD-10-CM

## 2020-04-08 NOTE — Telephone Encounter (Signed)
Please Advise. Last office visit 01/02/2020.  KP

## 2020-04-08 NOTE — Telephone Encounter (Signed)
Requested medication (s) are due for refill today - yes  Requested medication (s) are on the active medication list -no  Future visit scheduled -yes  Last refill: 03/03/20  Notes to clinic: Request for non delegated Rx  Requested Prescriptions  Pending Prescriptions Disp Refills   ALPRAZolam (XANAX) 0.25 MG tablet [Pharmacy Med Name: alprazolam 0.25 mg tablet] 45 tablet 1    Sig: TAKE ONE TABLET BY MOUTH TWICE DAILY AS NEEDED FOR ANXIETY      Not Delegated - Psychiatry:  Anxiolytics/Hypnotics Failed - 04/08/2020 10:49 AM      Failed - This refill cannot be delegated      Failed - Urine Drug Screen completed in last 360 days.      Passed - Valid encounter within last 6 months    Recent Outpatient Visits           3 months ago Essential hypertension   [facility], [facility]   9 months ago Annual physical exam   [facility] Glean [facility], MD   1 year ago Chronic renal disease, stage III [facility])   [facility], [facility]   2 years ago Annual physical exam   [facility] Glean Hess, MD   2 years ago Essential hypertension   [facility], [facility]       Future Appointments             In 4 weeks Army Melia Jesse Sans, MD [facility], [place]   In 3 months Army Melia Jesse Sans, MD [facility], [facility]                Requested Prescriptions  Pending Prescriptions Disp Refills   ALPRAZolam (XANAX) 0.25 MG tablet [Pharmacy Med Name: alprazolam 0.25 mg tablet] 45 tablet 1    Sig: TAKE ONE TABLET BY MOUTH TWICE DAILY AS NEEDED FOR ANXIETY      Not Delegated - Psychiatry:  Anxiolytics/Hypnotics Failed - 04/08/2020 10:49 AM      Failed - This refill cannot be delegated      Failed - Urine Drug Screen completed in last 360 days.      Passed - Valid encounter within last 6 months    Recent Outpatient Visits           3 months ago Essential hypertension   [facility]   9 months ago Annual physical exam   [facility] Glean [facility], MD   1 year ago Chronic renal disease, stage III [facility])   [facility]   2 years ago Annual physical exam   [facility] Glean Hess, MD   2 years ago Essential hypertension   [facility] Glean [facility], MD       Future Appointments             In 4 weeks Army Melia Jesse Sans, MD [facility], [place]   In 3 months Army Melia Jesse Sans, MD [facility], [facility]

## 2020-04-15 ENCOUNTER — Other Ambulatory Visit: Payer: Self-pay | Admitting: Internal Medicine

## 2020-04-15 DIAGNOSIS — M47816 Spondylosis without myelopathy or radiculopathy, lumbar region: Secondary | ICD-10-CM

## 2020-04-15 NOTE — Telephone Encounter (Signed)
Requested medication (s) are due for refill today: yes  Requested medication (s) are on the active medication list: yes  Last refill:  10/05/19 #120 with 2 refills  Future visit scheduled: yes  Notes to clinic:  Please review for refill. Refill not delegated per protocol    Requested Prescriptions  Pending Prescriptions Disp Refills   traMADol (ULTRAM) 50 MG tablet [Pharmacy Med Name: tramadol 50 mg tablet] 120 tablet 2    Sig: TAKE ONE TABLET BY MOUTH EVERY 6 HOURS AS NEEDED      Not Delegated - Analgesics:  Opioid Agonists Failed - 04/15/2020 10:31 AM      Failed - This refill cannot be delegated      Failed - Urine Drug Screen completed in last 360 days.      Passed - Valid encounter within last 6 months    Recent Outpatient Visits           3 months ago Essential hypertension   [facility] Glean [facility], [facility]   9 months ago Annual physical exam   [facility] Glean [facility], MD   1 year ago Chronic renal disease, stage III [facility])   [facility] Glean [facility], [facility]   2 years ago Annual physical exam   [facility] Glean Hess, MD   2 years ago Essential hypertension   [facility] Glean Hess, MD       Future Appointments             In 3 weeks Army Melia Jesse Sans, MD [facility], [place]   In 3 months Army Melia Jesse Sans, MD [facility], [facility]

## 2020-04-15 NOTE — Telephone Encounter (Signed)
Please Advise. Last office visit 01/02/2020.  KP

## 2020-04-20 ENCOUNTER — Telehealth: Payer: Self-pay

## 2020-04-20 NOTE — Telephone Encounter (Signed)
Faxed off [place] form for patients tramadol 50 mg tablets. Waiting for outcome from insurance company. Unsure of formulary alternatives for this. Patient has been taking this medication since June of 2018.   CM

## 2020-04-23 NOTE — Telephone Encounter (Signed)
Received fax from [place].   Patient medication is Approved through 04/22/2120-10/20/2020.  CM

## 2020-04-29 ENCOUNTER — Other Ambulatory Visit: Payer: Self-pay | Admitting: Internal Medicine

## 2020-04-29 DIAGNOSIS — F411 Generalized anxiety disorder: Secondary | ICD-10-CM

## 2020-04-29 NOTE — Telephone Encounter (Signed)
Requested medication (s) are due for refill today - may be a little soon  Requested medication (s) are on the active medication list -yes  Future visit scheduled -yes  Last refill: 04/08/20  Notes to clinic: Request for RF non delegated Rx  Requested Prescriptions  Pending Prescriptions Disp Refills   ALPRAZolam (XANAX) 0.25 MG tablet [Pharmacy Med Name: alprazolam 0.25 mg tablet] 45 tablet 0    Sig: TAKE ONE TABLET BY MOUTH TWICE DAILY AS NEEDED FOR ANXIETY      Not Delegated - Psychiatry:  Anxiolytics/Hypnotics Failed - 04/29/2020  9:11 AM      Failed - This refill cannot be delegated      Failed - Urine Drug Screen completed in last 360 days.      Passed - Valid encounter within last 6 months    Recent Outpatient Visits           3 months ago Essential hypertension   [facility]   10 months ago Annual physical exam   [facility], [facility]   1 year ago Chronic renal disease, stage III [facility])   [facility]   2 years ago Annual physical exam   [facility]   2 years ago Essential hypertension   [facility], [facility]       Future Appointments             In 1 week Army Melia Jesse Sans, MD [facility], [place]   In 2 months Army Melia, Jesse Sans, MD [facility], [facility]             Signed Prescriptions Disp Refills   omeprazole (PRILOSEC) 20 MG capsule 60 capsule 0    Sig: TAKE ONE CAPSULE BY MOUTH TWICE DAILY      Gastroenterology: Proton Pump Inhibitors Passed - 04/29/2020  9:11 AM      Passed - Valid encounter within last 12 months    Recent Outpatient Visits           3 months ago Essential hypertension   [facility]   10 months ago Annual physical exam   [facility], [facility]   1 year ago Chronic renal disease, stage III [facility])   [facility]   2 years ago Annual physical exam   [facility] Glean Hess, MD   2 years ago Essential hypertension   [facility] Gle[facility], [facility]       Future Appointments             In 1 week Army Melia Jesse Sans, MD [facility], [place]   In 2 months Army Melia Jesse Sans, MD [facility], [facility]                Requested Prescriptions  Pending Prescriptions Disp Refills   ALPRAZolam (XANAX) 0.25 MG tablet [Pharmacy Med Name: alprazolam 0.25 mg tablet] 45 tablet 0    Sig: TAKE ONE TABLET BY MOUTH TWICE DAILY AS NEEDED FOR ANXIETY      Not Delegated - Psychiatry:  Anxiolytics/Hypnotics Failed - 04/29/2020  9:11 AM      Failed - This refill cannot be delegated      Failed - Urine Drug Screen completed in last 360 days.      Passed -  Valid encounter within last 6 months    Recent Outpatient Visits           3 months ago Essential hypertension   [facility]   10 months ago Annual physical exam   [facility] Glean Hess, MD   1 year ago Chronic renal disease, stage III [facility])   [facility]   2 years ago Annual physical exam   [facility] Glean Hess, MD   2 years ago Essential hypertension   [facility] Gle[facility], MD       Future Appointments             In 1 week Army Melia Jesse Sans, MD [facility], [place]   In 2 months Army Melia Jesse Sans, MD [facility], [facility]             Signed Prescriptions Disp Refills   omeprazole (PRILOSEC) 20 MG capsule 60 capsule 0    Sig: TAKE ONE CAPSULE BY MOUTH TWICE DAILY      Gastroenterology: Proton Pump Inhibitors Passed - 04/29/2020  9:11 AM      Passed - Valid encounter within last 12 months    Recent Outpatient Visits           3 months ago Essential hypertension   [facility], [facility]   10 months ago Annual physical exam   [facility] Gle[facility] Hess, MD   1 year ago Chronic renal disease, stage III [facility])   [facility]   2 years ago Annual physical exam   [facility] Glean Hess, MD   2 years ago Essential hypertension   [facility]       Future Appointments             In 1 week Army Melia Jesse Sans, MD [facility], [place]   In 2 months Army Melia Jesse Sans, MD [facility], [facility]

## 2020-04-29 NOTE — Telephone Encounter (Signed)
Appointment 05/07/20- RF per protocol.

## 2020-04-29 NOTE — Telephone Encounter (Signed)
Please send in when you return

## 2020-04-30 ENCOUNTER — Other Ambulatory Visit: Payer: Self-pay | Admitting: Internal Medicine

## 2020-04-30 DIAGNOSIS — F411 Generalized anxiety disorder: Secondary | ICD-10-CM

## 2020-04-30 NOTE — Telephone Encounter (Signed)
Requested medication (s) are due for refill today: yes  Requested medication (s) are on the active medication list: yes  Last refill:  04/08/20  Future visit scheduled: yes  Notes to clinic:  not delegated    Requested Prescriptions  Pending Prescriptions Disp Refills   ALPRAZolam (XANAX) 0.25 MG tablet [Pharmacy Med Name: alprazolam 0.25 mg tablet] 45 tablet 0    Sig: TAKE ONE TABLET BY MOUTH TWICE DAILY AS NEEDED FOR ANXIETY      Not Delegated - Psychiatry:  Anxiolytics/Hypnotics Failed - 04/30/2020  8:33 AM      Failed - This refill cannot be delegated      Failed - Urine Drug Screen completed in last 360 days.      Passed - Valid encounter within last 6 months    Recent Outpatient Visits           3 months ago Essential hypertension   [facility]   10 months ago Annual physical exam   [facility] Gle[facility], MD   1 year ago Chronic renal disease, stage III [facility])   [facility]   2 years ago Annual physical exam   [facility] Gle[facility], MD   2 years ago Essential hypertension   [facility] Glean Hess, MD       Future Appointments             In 1 week Army Melia Jesse Sans, MD [facility], [place]   In 2 months Army Melia Jesse Sans, MD [facility], [facility]

## 2020-05-03 ENCOUNTER — Other Ambulatory Visit: Payer: Self-pay | Admitting: Internal Medicine

## 2020-05-03 DIAGNOSIS — F411 Generalized anxiety disorder: Secondary | ICD-10-CM

## 2020-05-03 NOTE — Telephone Encounter (Signed)
Requested medication (s) are due for refill today: yes   Requested medication (s) are on the active medication list: yes   Last refill:  start: 04/13/20 end: 05/13/20 #45 0 refills  Future visit scheduled: yes in 4 days   Notes to clinic:  not delegated per protocol     Requested Prescriptions  Pending Prescriptions Disp Refills   ALPRAZolam (XANAX) 0.25 MG tablet [Pharmacy Med Name: alprazolam 0.25 mg tablet] 45 tablet 0    Sig: TAKE ONE TABLET BY MOUTH TWICE DAILY AS NEEDED FOR ANXIETY      Not Delegated - Psychiatry:  Anxiolytics/Hypnotics Failed - 05/03/2020  5:14 PM      Failed - This refill cannot be delegated      Failed - Urine Drug Screen completed in last 360 days.      Passed - Valid encounter within last 6 months    Recent Outpatient Visits           4 months ago Essential hypertension   [facility]   10 months ago Annual physical exam   [facility] Gle[facility] Hess, MD   1 year ago Chronic renal disease, stage III [facility])   [facility]   2 years ago Annual physical exam   [facility] Gle[facility], MD   2 years ago Essential hypertension   [facility] Gle[facility], [facility]       Future Appointments             In 4 days Glean Hess, MD [facility], [place]   In 2 months Army Melia Jesse Sans, MD [facility], [facility]

## 2020-05-03 NOTE — Telephone Encounter (Signed)
Please Advise. Last office visit 01/02/2020.  KP

## 2020-05-06 ENCOUNTER — Other Ambulatory Visit: Payer: Self-pay | Admitting: Internal Medicine

## 2020-05-06 DIAGNOSIS — F411 Generalized anxiety disorder: Secondary | ICD-10-CM

## 2020-05-06 NOTE — Telephone Encounter (Signed)
Requested medication (s) are due for refill today: Yes  Requested medication (s) are on the active medication list: Yes  Last refill:  04/08/20  Future visit scheduled: Yes  Notes to clinic:  See request.    Requested Prescriptions  Pending Prescriptions Disp Refills   ALPRAZolam (XANAX) 0.25 MG tablet 45 tablet 0    Sig: Take 1 tablet (0.25 mg total) by mouth 2 (two) times daily as needed. for anxiety      Not Delegated - Psychiatry:  Anxiolytics/Hypnotics Failed - 05/06/2020  2:50 PM      Failed - This refill cannot be delegated      Failed - Urine Drug Screen completed in last 360 days.      Passed - Valid encounter within last 6 months    Recent Outpatient Visits           4 months ago Essential hypertension   [facility]   10 months ago Annual physical exam   [facility] Glean [facility], MD   1 year ago Chronic renal disease, stage III [facility])   [facility] Glean [facility], [facility]   2 years ago Annual physical exam   [facility] Glean Hess, MD   2 years ago Essential hypertension   [facility] Glean Hess, MD       Future Appointments             Tomorrow Glean Hess, MD [facility], [place]   In 2 months Army Melia Jesse Sans, MD [facility], [facility]

## 2020-05-06 NOTE — Telephone Encounter (Signed)
Pharmacy called for pt to request a refill of ALPRAZolam Duanne Moron) 0.25 MG tablet  [place] Phone:  [phone]  Fax:  [phone]

## 2020-05-06 NOTE — Telephone Encounter (Signed)
Requested medication (s) are due for refill today: yes  Requested medication (s) are on the active medication list: yes  Last refill: 04/08/20  #45  0 refills  Future visit scheduled  yes  05/07/20  Notes to clinic:not delegated  Requested Prescriptions  Pending Prescriptions Disp Refills   ALPRAZolam (XANAX) 0.25 MG tablet [Pharmacy Med Name: alprazolam 0.25 mg tablet] 45 tablet 0    Sig: TAKE ONE TABLET BY MOUTH TWICE DAILY AS NEEDED FOR ANXIETY      Not Delegated - Psychiatry:  Anxiolytics/Hypnotics Failed - 05/06/2020 11:48 AM      Failed - This refill cannot be delegated      Failed - Urine Drug Screen completed in last 360 days.      Passed - Valid encounter within last 6 months    Recent Outpatient Visits           4 months ago Essential hypertension   [facility] Glean [facility], MD   10 months ago Annual physical exam   [facility]an Hess, MD   1 year ago Chronic renal disease, stage III [facility])   [facility] Glean [facility], MD   2 years ago Annual physical exam   [facility] Glean Hess, MD   2 years ago Essential hypertension   [facility] Glean [facility], MD       Future Appointments             Tomorrow Glean Hess, MD [facility], [place]   In 2 months Army Melia Jesse Sans, MD [facility], [facility]

## 2020-05-07 ENCOUNTER — Other Ambulatory Visit: Payer: Self-pay

## 2020-05-07 ENCOUNTER — Encounter: Payer: Self-pay | Admitting: Internal Medicine

## 2020-05-07 ENCOUNTER — Ambulatory Visit (INDEPENDENT_AMBULATORY_CARE_PROVIDER_SITE_OTHER): Payer: PPO | Admitting: Internal Medicine

## 2020-05-07 VITALS — BP 128/64 | HR 83 | Ht 64.0 in | Wt 152.0 lb

## 2020-05-07 DIAGNOSIS — N184 Chronic kidney disease, stage 4 (severe): Secondary | ICD-10-CM

## 2020-05-07 DIAGNOSIS — F411 Generalized anxiety disorder: Secondary | ICD-10-CM | POA: Diagnosis not present

## 2020-05-07 DIAGNOSIS — I10 Essential (primary) hypertension: Secondary | ICD-10-CM | POA: Diagnosis not present

## 2020-05-07 NOTE — Progress Notes (Signed)
Date:  05/07/2020   Name:  Veronica Rowe   DOB:  [birth year]   MRN:  [id number]   Chief Complaint: Chronic Kidney Disease (Follow up with labs.) and Hypertension  Anxiety Presents for follow-up (requesting refills on xanax too early - apparently the pharmacy was calling; pt still has medication) visit. Symptoms include nervous/anxious behavior (intermittently). Patient reports no chest pain, dizziness or shortness of breath. Symptoms occur occasionally. The severity of symptoms is moderate.    Hypertension This is a chronic problem. The problem is controlled. Associated symptoms include anxiety. Pertinent negatives include no chest pain, headaches or shortness of breath. Past treatments include angiotensin blockers and diuretics. The current treatment provides significant improvement.  CKD - seen by Nephrology in 2019 but not since.  GFR is slowly worsening.  She is here for follow up.  Lab Results  Component Value Date   CREATININE 1.81 (H) 01/02/2020   BUN 30 (H) 01/02/2020   NA 139 01/02/2020   K 4.5 01/02/2020   CL 99 01/02/2020   CO2 24 01/02/2020   Lab Results  Component Value Date   CHOL 160 07/04/2019   HDL 57 07/04/2019   LDLCALC 89 07/04/2019   TRIG 74 07/04/2019   CHOLHDL 2.8 07/04/2019   Lab Results  Component Value Date   TSH 2.620 07/04/2019   No results found for: HGBA1C Lab Results  Component Value Date   WBC 8.4 07/04/2019   HGB 14.0 07/04/2019   HCT 41.4 07/04/2019   MCV 85 07/04/2019   PLT 248 07/04/2019   Lab Results  Component Value Date   ALT 6 07/04/2019   AST 15 07/04/2019   ALKPHOS 85 07/04/2019   BILITOT 0.2 07/04/2019     Review of Systems  Constitutional: Negative for chills, fatigue and fever.  Respiratory: Negative for cough, chest tightness and shortness of breath.   Cardiovascular: Negative for chest pain and leg swelling.  Gastrointestinal: Negative for abdominal pain and constipation.  Genitourinary: Negative for  difficulty urinating and hematuria.  Neurological: Negative for dizziness and headaches.  Psychiatric/Behavioral: Negative for dysphoric mood and sleep disturbance. The patient is nervous/anxious (intermittently).     Patient Active Problem List   Diagnosis Date Noted  . Osteoarthritis of fingers of both hands 01/02/2020  . Lumbar spondylosis 03/27/2016  . Vitamin D deficiency 08/17/2015  . Essential hypertension 07/07/2015  . Major depressive disorder with single episode, in partial remission ([place]) 07/07/2015  . CKD (chronic kidney disease) stage 4, GFR 15-29 ml/min (HCC) 06/26/2015  . Hx of peptic ulcer 06/26/2015  . Generalized psoriasis 06/26/2015  . Stress incontinence, female 06/26/2015  . Dyslipidemia 06/26/2015  . Generalized anxiety disorder 06/26/2015    Allergies  Allergen Reactions  . Codeine Itching and Other (See Comments)    hallucinations    Past Surgical History:  Procedure Laterality Date  . BREAST EXCISIONAL BIOPSY Right 2013   The patient's Baker Janus model risk for breast cancer is 1.4% for the next 5 years, 4.4% lifetime    Social History   Tobacco Use  . Smoking status: Never Smoker  . Smokeless tobacco: Never Used  Vaping Use  . Vaping Use: Never used  Substance Use Topics  . Alcohol use: No    Alcohol/week: 0.0 standard drinks  . Drug use: No     Medication list has been reviewed and updated.  Current Meds  Medication Sig  . acetaminophen (TYLENOL) 325 MG tablet Take 650 mg by mouth every 6 (six)  hours as needed.  . ALPRAZolam (XANAX) 0.25 MG tablet TAKE ONE TABLET BY MOUTH TWICE DAILY AS NEEDED FOR ANXIETY  . betamethasone dipropionate (DIPROLENE) 0.05 % cream Apply topically 2 (two) times daily.  . cholecalciferol (VITAMIN D) 1000 UNITS tablet Take 1,000 Units by mouth daily.  . fluticasone (FLONASE) 50 MCG/ACT nasal spray Place 2 sprays into both nostrils daily.  Marland Kitchen losartan (COZAAR) 100 MG tablet TAKE ONE TABLET BY MOUTH ONCE DAILY  .  mirtazapine (REMERON) 15 MG tablet TAKE ONE TABLET BY MOUTH AT BEDTIME  . omeprazole (PRILOSEC) 20 MG capsule TAKE ONE CAPSULE BY MOUTH TWICE DAILY  . sertraline (ZOLOFT) 100 MG tablet TAKE ONE TABLET BY MOUTH ONCE DAILY  . simvastatin (ZOCOR) 20 MG tablet TAKE ONE TABLET BY MOUTH EVERY EVENING  . traMADol (ULTRAM) 50 MG tablet TAKE ONE TABLET BY MOUTH EVERY 6 HOURS AS NEEDED  . triamterene-hydrochlorothiazide (MAXZIDE-25) 37.5-25 MG tablet TAKE ONE TABLET BY MOUTH ONCE DAILY    PHQ 2/9 Scores 05/07/2020 01/02/2020 12/31/2019 07/04/2019  PHQ - 2 Score 0 0 0 2  PHQ- 9 Score 0 0 - 2    GAD 7 : Generalized Anxiety Score 05/07/2020 01/02/2020  Nervous, Anxious, on Edge 0 0  Control/stop worrying 0 0  Worry too much - different things 0 0  Trouble relaxing 0 0  Restless 0 0  Easily annoyed or irritable 0 0  Afraid - awful might happen 0 0  Total GAD 7 Score 0 0  Anxiety Difficulty Not difficult at all Not difficult at all    BP Readings from Last 3 Encounters:  05/07/20 128/64  01/02/20 104/62  07/04/19 136/80    Physical Exam Vitals and nursing note reviewed.  Constitutional:      General: She is not in acute distress.    Appearance: She is well-developed.  HENT:     Head: Normocephalic and atraumatic.  Neck:     Vascular: No carotid bruit.  Cardiovascular:     Rate and Rhythm: Normal rate and regular rhythm.     Pulses: Normal pulses.  Pulmonary:     Effort: Pulmonary effort is normal. No respiratory distress.     Breath sounds: No stridor. No wheezing or rhonchi.  Musculoskeletal:     Cervical back: Normal range of motion.     Left lower leg: No edema.  Lymphadenopathy:     Cervical: No cervical adenopathy.  Skin:    General: Skin is warm and dry.     Capillary Refill: Capillary refill takes less than 2 seconds.     Findings: No rash.  Neurological:     General: No focal deficit present.     Mental Status: She is alert and oriented to person, place, and time.    Psychiatric:        Mood and Affect: Mood normal.        Behavior: Behavior normal.     Wt Readings from Last 3 Encounters:  05/07/20 152 lb (68.9 kg)  01/02/20 144 lb (65.3 kg)  07/04/19 150 lb (68 kg)    BP 128/64   Pulse 83   Ht 5\' 4"  (1.626 m)   Wt 152 lb (68.9 kg)   SpO2 100%   BMI 26.09 kg/m   Assessment and Plan: 1. Generalized anxiety disorder Continue PRN xanax Continue Remeron and Sertraline  2. CKD (chronic kidney disease) stage 4, GFR 15-29 ml/min (HCC) Patient remains asymptomatic Reminded to avoid all NSAIDS - Comprehensive metabolic panel - VITAMIN  D 25 Hydroxy (Vit-D Deficiency, Fractures) - Parathyroid hormone, intact (no Ca)  3. Essential hypertension Clinically stable exam with well controlled BP. Tolerating medications without side effects at this time. Pt to continue current regimen and low sodium diet; benefits of regular exercise as able discussed.   Partially dictated using Editor, commissioning. Any errors are unintentional.  Halina Maidens, MD [facility]  05/07/2020

## 2020-05-08 LAB — COMPREHENSIVE METABOLIC PANEL
ALT: 11 IU/L (ref 0–32)
AST: 14 IU/L (ref 0–40)
Albumin/Globulin Ratio: 1.6 (ref 1.2–2.2)
Albumin: 4.2 g/dL (ref 3.7–4.7)
Alkaline Phosphatase: 91 IU/L (ref 44–121)
BUN/Creatinine Ratio: 22 (ref 12–28)
BUN: 32 mg/dL — ABNORMAL HIGH (ref 8–27)
Bilirubin Total: 0.3 mg/dL (ref 0.0–1.2)
CO2: 28 mmol/L (ref 20–29)
Calcium: 9.5 mg/dL (ref 8.7–10.3)
Chloride: 100 mmol/L (ref 96–106)
Creatinine, Ser: 1.47 mg/dL — ABNORMAL HIGH (ref 0.57–1.00)
GFR calc Af Amer: 39 mL/min/{1.73_m2} — ABNORMAL LOW (ref >59)
GFR calc non Af Amer: 34 mL/min/{1.73_m2} — ABNORMAL LOW (ref >59)
Globulin, Total: 2.6 g/dL (ref 1.5–4.5)
Glucose: 89 mg/dL (ref 65–99)
Potassium: 4.4 mmol/L (ref 3.5–5.2)
Sodium: 142 mmol/L (ref 134–144)
Total Protein: 6.8 g/dL (ref 6.0–8.5)

## 2020-05-08 LAB — VITAMIN D 25 HYDROXY (VIT D DEFICIENCY, FRACTURES): Vit D, 25-Hydroxy: 56.8 ng/mL (ref 30.0–100.0)

## 2020-05-08 LAB — PARATHYROID HORMONE, INTACT (NO CA): PTH: 29 pg/mL (ref 15–65)

## 2020-05-12 ENCOUNTER — Other Ambulatory Visit: Payer: Self-pay | Admitting: Internal Medicine

## 2020-05-12 ENCOUNTER — Ambulatory Visit: Payer: PPO | Admitting: Internal Medicine

## 2020-05-12 DIAGNOSIS — F411 Generalized anxiety disorder: Secondary | ICD-10-CM

## 2020-06-04 ENCOUNTER — Other Ambulatory Visit: Payer: Self-pay

## 2020-06-04 ENCOUNTER — Ambulatory Visit
Admission: RE | Admit: 2020-06-04 | Discharge: 2020-06-04 | Disposition: A | Discharge status: Home / Self Care | Payer: PPO | Source: Ambulatory Visit | Attending: Internal Medicine | Admitting: Internal Medicine

## 2020-06-04 DIAGNOSIS — Z1231 Encounter for screening mammogram for malignant neoplasm of breast: Secondary | ICD-10-CM | POA: Insufficient documentation

## 2020-06-16 ENCOUNTER — Other Ambulatory Visit: Payer: Self-pay | Admitting: Internal Medicine

## 2020-06-16 DIAGNOSIS — F411 Generalized anxiety disorder: Secondary | ICD-10-CM

## 2020-06-16 NOTE — Telephone Encounter (Signed)
Requested medication (s) are due for refill today: yes  Requested medication (s) are on the active medication list: no  Last refill:  05/12/20  Future visit scheduled: yes  Notes to clinic: not delegated; not on active med list    Requested Prescriptions  Pending Prescriptions Disp Refills   ALPRAZolam (XANAX) 0.25 MG tablet [Pharmacy Med Name: alprazolam 0.25 mg tablet] 45 tablet 0    Sig: TAKE ONE TABLET BY MOUTH TWICE DAILY AS NEEDED FOR ANXIETY      Not Delegated - Psychiatry:  Anxiolytics/Hypnotics Failed - 06/16/2020  2:30 PM      Failed - This refill cannot be delegated      Failed - Urine Drug Screen completed in last 360 days      Passed - Valid encounter within last 6 months    Recent Outpatient Visits           1 month ago Generalized anxiety disorder   [place], Laura H, MD   5 months ago Essential hypertension   [facility] Glean Hess, MD   11 months ago Annual physical exam   [facility] Glean [facility], MD   1 year ago Chronic renal disease, stage III [facility])   [facility] Glean [facility], MD   2 years ago Annual physical exam   [facility] Glean Hess, MD       Future Appointments             In 1 month Army Melia, Jesse Sans, MD [facility], [facility]

## 2020-06-16 NOTE — Telephone Encounter (Signed)
Please Advise. Last office visit 05/07/2020.  KP

## 2020-06-24 ENCOUNTER — Telehealth: Payer: Self-pay | Admitting: *Deleted

## 2020-06-24 NOTE — Chronic Care Management (AMB) (Signed)
  Chronic Care Management   Note  06/24/2020 Name: SHAVONN CONVEY MRN: [id number] DOB: 27-May-1941  ALEKSIS JIGGETTS is a 79 y.o. year old female who is a primary care patient of Glean Hess, MD. I reached out to Orlin Hilding by phone today in response to a referral sent by Ms. Gwenlyn Perking Swier's health plan.     Ms. Plath was given information about Chronic Care Management services today including:  1. CCM service includes personalized support from designated clinical staff supervised by her physician, including individualized plan of care and coordination with other care providers 2. 24/7 contact phone numbers for assistance for urgent and routine care needs. 3. Service will only be billed when office clinical staff spend 20 minutes or more in a month to coordinate care. 4. Only one practitioner may furnish and bill the service in a calendar month. 5. The patient may stop CCM services at any time (effective at the end of the month) by phone call to the office staff. 6. The patient will be responsible for cost sharing (co-pay) of up to 20% of the service fee (after annual deductible is met).  Patient agreed to services and verbal consent obtained.   Follow up plan: Telephone appointment with care management team member scheduled for:07/29/2020  [facility]  Direct Dial: [phone]

## 2020-07-06 ENCOUNTER — Other Ambulatory Visit: Payer: Self-pay | Admitting: Internal Medicine

## 2020-07-06 DIAGNOSIS — F411 Generalized anxiety disorder: Secondary | ICD-10-CM

## 2020-07-06 DIAGNOSIS — F324 Major depressive disorder, single episode, in partial remission: Secondary | ICD-10-CM

## 2020-07-06 DIAGNOSIS — I10 Essential (primary) hypertension: Secondary | ICD-10-CM

## 2020-07-06 NOTE — Telephone Encounter (Signed)
Requested medication (s) are due for refill today: yes  Requested medication (s) are on the active medication list: yes  Last refill:  start: 06/17/20 end: 07/17/20 #45 0 refills   Future visit scheduled: yes in 2 weeks  Notes to clinic:  not delegated per protocol     Requested Prescriptions  Pending Prescriptions Disp Refills   ALPRAZolam (XANAX) 0.25 MG tablet [Pharmacy Med Name: alprazolam 0.25 mg tablet] 45 tablet 0    Sig: TAKE ONE TABLET BY MOUTH TWICE DAILY AS NEEDED FOR ANXIETY      Not Delegated - Psychiatry:  Anxiolytics/Hypnotics Failed - 07/06/2020  1:25 PM      Failed - This refill cannot be delegated      Failed - Urine Drug Screen completed in last 360 days      Passed - Valid encounter within last 6 months    Recent Outpatient Visits           2 months ago Generalized anxiety disorder   [place], Laura H, MD   6 months ago Essential hypertension   [facility] Glean Hess, MD   1 year ago Annual physical exam   [facility] Glean Hess, MD   1 year ago Chronic renal disease, stage III [facility])   [facility] Glean Hess, MD   2 years ago Annual physical exam   [facility] Glean Hess, MD       Future Appointments             In 2 weeks Army Melia Jesse Sans, MD [facility], [facility]             Signed Prescriptions Disp Refills   omeprazole (PRILOSEC) 20 MG capsule 60 capsule 0    Sig: TAKE ONE CAPSULE BY MOUTH TWICE DAILY      Gastroenterology: Proton Pump Inhibitors Passed - 07/06/2020  1:25 PM      Passed - Valid encounter within last 12 months    Recent Outpatient Visits           2 months ago Generalized anxiety disorder   [place], Laura H, MD   6 months ago Essential hypertension   [facility] Glean Hess, MD   1 year ago Annual physical exam   [facility] Glean Hess, MD   1 year ago Chronic  renal disease, stage III [facility])   [facility] Glean Hess, MD   2 years ago Annual physical exam   [facility] Glean Hess, MD       Future Appointments             In 2 weeks Army Melia Jesse Sans, MD [facility], [facility]

## 2020-07-15 ENCOUNTER — Other Ambulatory Visit: Payer: Self-pay | Admitting: Internal Medicine

## 2020-07-15 DIAGNOSIS — F411 Generalized anxiety disorder: Secondary | ICD-10-CM

## 2020-07-15 NOTE — Telephone Encounter (Signed)
Requested medication (s) are due for refill today- yes  Requested medication (s) are on the active medication list -yes  Future visit scheduled -yes  Last refill: 06/17/20  Notes to clinic: Request Rx- non delegated  Requested Prescriptions  Pending Prescriptions Disp Refills   ALPRAZolam (XANAX) 0.25 MG tablet [Pharmacy Med Name: alprazolam 0.25 mg tablet] 45 tablet 0    Sig: TAKE ONE TABLET BY MOUTH TWICE DAILY AS NEEDED FOR ANXIETY      Not Delegated - Psychiatry:  Anxiolytics/Hypnotics Failed - 07/15/2020  9:47 AM      Failed - This refill cannot be delegated      Failed - Urine Drug Screen completed in last 360 days      Passed - Valid encounter within last 6 months    Recent Outpatient Visits           2 months ago Generalized anxiety disorder   [facility] Glean Hess, MD   6 months ago Essential hypertension   [facility] Glean Hess, MD   1 year ago Annual physical exam   [facility] Glean Hess, MD   1 year ago Chronic renal disease, stage III [facility])   [facility] Glean Hess, MD   2 years ago Annual physical exam   [facility] Glean Hess, MD       Future Appointments             In 6 days Army Melia Jesse Sans, MD [facility], [place]                Requested Prescriptions  Pending Prescriptions Disp Refills   ALPRAZolam Duanne Moron) 0.25 MG tablet [Pharmacy Med Name: alprazolam 0.25 mg tablet] 45 tablet 0    Sig: TAKE ONE TABLET BY MOUTH TWICE DAILY AS NEEDED FOR ANXIETY      Not Delegated - Psychiatry:  Anxiolytics/Hypnotics Failed - 07/15/2020  9:47 AM      Failed - This refill cannot be delegated      Failed - Urine Drug Screen completed in last 360 days      Passed - Valid encounter within last 6 months    Recent Outpatient Visits           2 months ago Generalized anxiety disorder   [place], Laura H, MD   6 months ago Essential  hypertension   [facility] Glean Hess, MD   1 year ago Annual physical exam   [facility] Glean Hess, MD   1 year ago Chronic renal disease, stage III [facility])   [facility] Glean Hess, MD   2 years ago Annual physical exam   [facility] Glean Hess, MD       Future Appointments             In 6 days Glean Hess, MD [facility], [facility]

## 2020-07-15 NOTE — Telephone Encounter (Signed)
Please Advise. Last office visit 05/07/20.  KP

## 2020-07-21 ENCOUNTER — Encounter: Payer: PPO | Admitting: Internal Medicine

## 2020-07-29 ENCOUNTER — Ambulatory Visit: Payer: PPO | Admitting: Pharmacist

## 2020-07-29 DIAGNOSIS — E785 Hyperlipidemia, unspecified: Secondary | ICD-10-CM

## 2020-07-29 DIAGNOSIS — N184 Chronic kidney disease, stage 4 (severe): Secondary | ICD-10-CM

## 2020-07-29 DIAGNOSIS — I10 Essential (primary) hypertension: Secondary | ICD-10-CM

## 2020-07-29 NOTE — Chronic Care Management (AMB) (Signed)
Chronic Care Management Pharmacy  Name: Veronica Rowe  MRN: [id number] DOB: [birth year]   Chief Complaint/ HPI  Veronica Rowe,  79 y.o. , female presents for her Initial CCM visit with the clinical pharmacist via telephone. Spoke with patient's daughter, Nikie Cid.  PCP : Glean Hess, MD Patient Care Team: Glean Hess, MD as PCP - General (Internal Medicine) Bary Castilla [place] Gleason, MD (General Surgery) Vladimir Faster, [facility] (Pharmacist)  Patient's chronic conditions include: Hypertension, Hyperlipidemia, Chronic Kidney Disease, Depression, Anxiety, Osteoarthritis and lumbar spondylosis and h/o peptic ulcer   Office Visits: 05/07/20- Dr. Army Melia - blood work [id number]   Objective: Allergies  Allergen Reactions  . Codeine Itching and Other (See Comments)    hallucinations    Medications: Outpatient Encounter Medications as of 07/29/2020  Medication Sig  . acetaminophen (TYLENOL) 325 MG tablet Take 650 mg by mouth every 6 (six) hours as needed.  . ALPRAZolam (XANAX) 0.25 MG tablet TAKE ONE TABLET BY MOUTH TWICE DAILY AS NEEDED FOR ANXIETY  . Ascorbic Acid (VITAMIN C) 500 MG CAPS Take 500 mg by mouth daily.  . cholecalciferol (VITAMIN D) 1000 UNITS tablet Take 1,000 Units by mouth daily.  . fluticasone (FLONASE) 50 MCG/ACT nasal spray Place 2 sprays into both nostrils daily.  Marland Kitchen losartan (COZAAR) 100 MG tablet TAKE ONE TABLET BY MOUTH ONCE DAILY  . mirtazapine (REMERON) 15 MG tablet TAKE ONE TABLET BY MOUTH AT BEDTIME  . omeprazole (PRILOSEC) 20 MG capsule TAKE ONE CAPSULE BY MOUTH TWICE DAILY (Patient taking differently: daily.)  . sertraline (ZOLOFT) 100 MG tablet TAKE ONE TABLET BY MOUTH ONCE DAILY  . simvastatin (ZOCOR) 20 MG tablet TAKE ONE TABLET BY MOUTH EVERY EVENING  . traMADol (ULTRAM) 50 MG tablet TAKE ONE TABLET BY MOUTH EVERY 6 HOURS AS NEEDED  . triamterene-hydrochlorothiazide (MAXZIDE-25) 37.5-25 MG tablet TAKE ONE TABLET BY MOUTH ONCE DAILY  . zinc  gluconate 50 MG tablet Take 50 mg by mouth daily.  . betamethasone dipropionate (DIPROLENE) 0.05 % cream Apply topically 2 (two) times daily. (Patient not taking: Reported on 07/29/2020)   No facility-administered encounter medications on file as of 07/29/2020.    Wt Readings from Last 3 Encounters:  05/07/20 152 lb (68.9 kg)  01/02/20 144 lb (65.3 kg)  07/04/19 150 lb (68 kg)    Lab Results  Component Value Date   CREATININE 1.47 (H) 05/07/2020   BUN 32 (H) 05/07/2020   GFRNONAA 34 (L) 05/07/2020   GFRAA 39 (L) 05/07/2020   NA 142 05/07/2020   K 4.4 05/07/2020   CALCIUM 9.5 05/07/2020   CO2 28 05/07/2020     Current Diagnosis/Assessment:    Goals Addressed            This Visit's Progress   . Pharmacy Care Plan       CARE PLAN ENTRY (see longitudinal plan of care for additional care plan information)  Current Barriers:  . Chronic Disease Management support, education, and care coordination needs related to Hypertension, Hyperlipidemia, Chronic Kidney Disease, Depression, Anxiety, Osteoarthritis, and lumbar spondylosis   Hypertension/ CKD BP Readings from Last 3 Encounters:  05/07/20 128/64  01/02/20 104/62  07/04/19 136/80   . Pharmacist Clinical Goal(s): o Over the next 90 days, patient will work with PharmD and providers to maintain BP goal <130/80 . Current regimen:   Losartan 100 mg qd  Triamterene/HCT 37.5/25 mg qd . Interventions:  Reveiwed importance of checking BP at home  Reviewed most recent labs.  Encouraged follow up with nephrology  Will discuss possible alternate agents with PCP . Patient self care activities - Over the next 90 days, patient will: o Check BP weekly, document, and provide at future appointments o Ensure daily salt intake < 2300 mg/day  Hyperlipidemia Lab Results  Component Value Date/Time   LDLCALC 89 07/04/2019 09:21 AM   . Pharmacist Clinical Goal(s): o Over the next 90 days, patient will work with PharmD and  providers to maintain LDL goal < 100 . Current regimen:  o Simvastatin 20 mg qd . Interventions: o Provided diet and exercise counseling. . Patient self care activities - Over the next 90 days, patient will: o Take all medications as prescribed      Medication management . Pharmacist Clinical Goal(s): o Over the next 90 days, patient will work with PharmD and providers to achieve optimal medication adherence . Current pharmacy: [facility] . Interventions o Comprehensive medication review performed. o Continue current medication management strategy . Patient self care activities - Over the next 90 days, patient will: o Focus on medication adherence by fill dates o Take medications as prescribed o Report any questions or concerns to PharmD and/or provider(s)  Initial goal documentation        Depression / Anxiety   PHQ9 Score:  PHQ9 SCORE ONLY 05/07/2020 01/02/2020 12/31/2019  PHQ-9 Total Score 0 0 0   GAD7 Score: GAD 7 : Generalized Anxiety Score 05/07/2020 01/02/2020  Nervous, Anxious, on Edge 0 0  Control/stop worrying 0 0  Worry too much - different things 0 0  Trouble relaxing 0 0  Restless 0 0  Easily annoyed or irritable 0 0  Afraid - awful might happen 0 0  Total GAD 7 Score 0 0  Anxiety Difficulty Not difficult at all Not difficult at all    Patient has failed these meds in past: NA Patient is currently controlled on the following medications:  . Alprazolam 0.25 mg bid prn . Mirtazapine 15 mg qhs . Sertraline 100 mg qhs  We discussed:  Angie states patient is well controlled on this regimen. She takes alprazolam twice daily most days.  Denies any dizziness or drowsiness.   Plan  Continue current medications  Hypertension/ CKD   BP goal is:  <130/80  Office blood pressures are  BP Readings from Last 3 Encounters:  05/07/20 128/64  01/02/20 104/62  07/04/19 136/80   CMP Latest Ref Rng & Units 05/07/2020 01/02/2020 07/04/2019  Glucose 65 - 99 mg/dL  89 94 79  BUN 8 - 27 mg/dL 32(H) 30(H) 31(H)  Creatinine 0.57 - 1.00 mg/dL 1.47(H) 1.81(H) 1.32(H)  Sodium 134 - 144 mmol/L 142 139 141  Potassium 3.5 - 5.2 mmol/L 4.4 4.5 4.0  Chloride 96 - 106 mmol/L 100 99 100  CO2 20 - 29 mmol/L _0 Calcium 8.7 - 10.3 mg/dL 9.5 9.9 9.4  Total Protein 6.0 - 8.5 g/dL 6.8 - 6.9  Total Bilirubin 0.0 - 1.2 mg/dL 0.3 - 0.2  Alkaline Phos 44 - 121 IU/L 91 - 85  AST 0 - 40 IU/L 14 - 15  ALT 0 - 32 IU/L 11 - 6    Patient checks BP at home Never   Patient has failed these meds in the past: N/A Patient is currently controlled on the following medications:   Losartan 100 mg qd  Triamterene/HCT 37.5/25 mg qd   We discussed : Angie states they do not check patient's BP at home but do have  a cuff. I counseled to check occasionally to monitor for control. Reviewed patient has not followed up with nephrology since 2019. Janace Hoard is unsure of reason. Counseled her to consider follow -up and potential use of SGLT-2 to prevent further renal decline.   Plan  Consider alternative therapy to triamterene to avoid risk of hyperkalemia with decreased renal function and advanced age. Consider referral back to nephrology.     Hyperlipidemia   LDL goal < 100  Last lipids Lab Results  Component Value Date   CHOL 160 07/04/2019   HDL 57 07/04/2019   LDLCALC 89 07/04/2019   TRIG 74 07/04/2019   CHOLHDL 2.8 07/04/2019   Hepatic Function Latest Ref Rng & Units 05/07/2020 07/04/2019 01/29/2018  Total Protein 6.0 - 8.5 g/dL 6.8 6.9 7.4  Albumin 3.7 - 4.7 g/dL 4.2 4.2 4.5  AST 0 - 40 IU/L _0 ALT 0 - 32 IU/L _1 Alk Phosphatase 44 - 121 IU/L 91 85 98  Total Bilirubin 0.0 - 1.2 mg/dL 0.3 0.2 0.5     The 10-year ASCVD risk score Mikey Bussing DC Jr., et al., 2013) is: 25.7%   Values used to calculate the score:     Age: 42 years     Sex: Female     Is Non-Hispanic African American: No     Diabetic: No     Tobacco smoker: No     Systolic Blood Pressure:  128 mmHg     Is BP treated: Yes     HDL Cholesterol: 57 mg/dL     Total Cholesterol: 160 mg/dL   Patient has failed these meds in past: NA Patient is currently controlled on the following medications:  . Simvastatin 20 mg qd  We discussed:  Diet and exercise. Angie states they cook most meals at home and eat a healthy diet without any specific restrictions.  Plan  Continue current medications   Osteopenia / Osteoporosis/ vitamin d deficiency   Last DEXA Scan: 2012, Normal    Vit D, 25-Hydroxy  Date Value Ref Range Status  05/07/2020 56.8 30.0 - 100.0 ng/mL Final    Comment:    Vitamin D deficiency has been defined by the [place] practice guideline as a level of serum 25-OH vitamin D less than 20 ng/mL (1,2). The Endocrine Society went on to further define vitamin D insufficiency as a level between 21 and 29 ng/mL (2). 1. IOM (Institute of Medicine). 2010. Dietary reference    intakes for calcium and D. Donna: The    [employer]. 2. Holick MF, Binkley [place], Bischoff-Ferrari HA, et al.    Evaluation, treatment, and prevention of vitamin D    deficiency: an Endocrine Society clinical practice    guideline. JCEM. 2011 Jul; 96(7):1911-30.      Patient declines further BMD testing Patient has failed these meds in past: NA Patient is currently query controlled on the following medications:  Marland Kitchen Vitamin d 1000 iu daily  We discussed:  Recommend [phone] units of vitamin D daily. Recommend 1200 mg of calcium daily from dietary and supplemental sources. Recommend weight-bearing and muscle strengthening exercises for building and maintaining bone density.  Patient declines follow up DEXA  Plan  Consider addition of calcium supplementation and encourage patient to repeat DEXA scan.  Lumbar spondylosis/ Osteoarthritis   Patient has failed these meds in past: NA Patient is currently controlled on the following medications:   . Tramadol 50 mg  q6h prn . Acetaminophen 650 mg q6h prn  We discussed:  Per Angie, the patient has rheumatoid arthritis. Not followed by rheumatology. Takes acetaminophen rarely and tramadol usually twice daily. She denies and drowsiness, dizziness or falls.  Plan  Continue current medications    Vaccines   Reviewed and discussed patient's vaccination history.    Immunization History  Administered Date(s) Administered  . Fluad Quad(high Dose 65+) 07/04/2019  . Pneumococcal Conjugate-13 01/04/2016  . Pneumococcal Polysaccharide-23 10/05/2011  . Tdap 10/28/2013    Plan  Recommended patient receive COVID vaccine series and Shingrix vaccines.  PUD   Patient has failed these meds in past: NA Patient is currently controlled on the following medications:  . Omeprazole 20 mg qd  We discussed:  Angie states patient no longer experiences reflux symptoms.  Plan  Continue current medications   Medication Management   Patient's preferred pharmacy is:  [facility], [place], [place] [address] Phone: [phone] Fax: [place], [place] - [address] [place] [address] Phone: [phone] Fax: [phone]  Uses pill box? No - daughter manages medications Pt endorses 95% compliance  We discussed:  patient is satisfied with pharmacy services  Plan  Continue current medication management strategy    Follow up: 3 month phone visit  Junita Push. Kenton Kingfisher PharmD, [facility] [phone]

## 2020-07-29 NOTE — Patient Instructions (Addendum)
Visit Information  It was a pleasure speaking with you today! Thank you for letting me be a part of your care team. Please call with any questions or concerns.  Goals Addressed            This Visit's Progress   . Pharmacy Care Plan       CARE PLAN ENTRY (see longitudinal plan of care for additional care plan information)  Current Barriers:  . Chronic Disease Management support, education, and care coordination needs related to Hypertension, Hyperlipidemia, Chronic Kidney Disease, Depression, Anxiety, Osteoarthritis, and lumbar spondylosis   Hypertension/ CKD BP Readings from Last 3 Encounters:  05/07/20 128/64  01/02/20 104/62  07/04/19 136/80   . Pharmacist Clinical Goal(s): o Over the next 90 days, patient will work with PharmD and providers to maintain BP goal <130/80 . Current regimen:   Losartan 100 mg qd  Triamterene/HCT 37.5/25 mg qd . Interventions:  Reveiwed importance of checking BP at home  Reviewed most recent labs. Encouraged follow up with nephrology  Will discuss possible alternate agents with PCP . Patient self care activities - Over the next 90 days, patient will: o Check BP weekly, document, and provide at future appointments o Ensure daily salt intake < 2300 mg/day  Hyperlipidemia Lab Results  Component Value Date/Time   LDLCALC 89 07/04/2019 09:21 AM   . Pharmacist Clinical Goal(s): o Over the next 90 days, patient will work with PharmD and providers to maintain LDL goal < 100 . Current regimen:  o Simvastatin 20 mg qd . Interventions: o Provided diet and exercise counseling. . Patient self care activities - Over the next 90 days, patient will: o Take all medications as prescribed      Medication management . Pharmacist Clinical Goal(s): o Over the next 90 days, patient will work with PharmD and providers to achieve optimal medication adherence . Current pharmacy: [facility] . Interventions o Comprehensive medication review  performed. o Continue current medication management strategy . Patient self care activities - Over the next 90 days, patient will: o Focus on medication adherence by fill dates o Take medications as prescribed o Report any questions or concerns to PharmD and/or provider(s)  Initial goal documentation        Veronica Rowe was given information about Chronic Care Management services today including:  1. CCM service includes personalized support from designated clinical staff supervised by her physician, including individualized plan of care and coordination with other care providers 2. 24/7 contact phone numbers for assistance for urgent and routine care needs. 3. Standard insurance, coinsurance, copays and deductibles apply for chronic care management only during months in which we provide at least 20 minutes of these services. Most insurances cover these services at 100%, however patients may be responsible for any copay, coinsurance and/or deductible if applicable. This service may help you avoid the need for more expensive face-to-face services. 4. Only one practitioner may furnish and bill the service in a calendar month. 5. The patient may stop CCM services at any time (effective at the end of the month) by phone call to the office staff.  Patient agreed to services and verbal consent obtained.   The patient verbalized understanding of instructions, educational materials, and care plan provided today and agreed to receive a mailed copy of patient instructions, educational materials, and care plan.  Telephone follow up appointment with pharmacy team member scheduled for:3 months  Junita Push. Kenton Kingfisher PharmD, BCPS Clinical Pharmacist [phone]  Managing Your Hypertension Hypertension is  commonly called high blood pressure. This is when the force of your blood pressing against the walls of your arteries is too strong. Arteries are blood vessels that carry blood from your heart throughout  your body. Hypertension forces the heart to work harder to pump blood, and may cause the arteries to become narrow or stiff. Having untreated or uncontrolled hypertension can cause heart attack, stroke, kidney disease, and other problems. What are blood pressure readings? A blood pressure reading consists of a higher number over a lower number. Ideally, your blood pressure should be below 120/80. The first ("top") number is called the systolic pressure. It is a measure of the pressure in your arteries as your heart beats. The second ("bottom") number is called the diastolic pressure. It is a measure of the pressure in your arteries as the heart relaxes. What does my blood pressure reading mean? Blood pressure is classified into four stages. Based on your blood pressure reading, your health care provider may use the following stages to determine what type of treatment you need, if any. Systolic pressure and diastolic pressure are measured in a unit called mm Hg. Normal  Systolic pressure: below 322.  Diastolic pressure: below 80. Elevated  Systolic pressure: 025-427.  Diastolic pressure: below 80. Hypertension stage 1  Systolic pressure: 062-376.  Diastolic pressure: 28-31. Hypertension stage 2  Systolic pressure: 517 or above.  Diastolic pressure: 90 or above. What health risks are associated with hypertension? Managing your hypertension is an important responsibility. Uncontrolled hypertension can lead to:  A heart attack.  A stroke.  A weakened blood vessel (aneurysm).  Heart failure.  Kidney damage.  Eye damage.  Metabolic syndrome.  Memory and concentration problems. What changes can I make to manage my hypertension? Hypertension can be managed by making lifestyle changes and possibly by taking medicines. Your health care provider will help you make a plan to bring your blood pressure within a normal range. Eating and drinking   Eat a diet that is high in fiber and  potassium, and low in salt (sodium), added sugar, and fat. An example eating plan is called the DASH (Dietary Approaches to Stop Hypertension) diet. To eat this way: ? Eat plenty of fresh fruits and vegetables. Try to fill half of your plate at each meal with fruits and vegetables. ? Eat whole grains, such as whole wheat pasta, brown rice, or whole grain bread. Fill about one quarter of your plate with whole grains. ? Eat low-fat diary products. ? Avoid fatty cuts of meat, processed or cured meats, and poultry with skin. Fill about one quarter of your plate with lean proteins such as fish, chicken without skin, beans, eggs, and tofu. ? Avoid premade and processed foods. These tend to be higher in sodium, added sugar, and fat.  Reduce your daily sodium intake. Most people with hypertension should eat less than 1,500 mg of sodium a day.  Limit alcohol intake to no more than 1 drink a day for nonpregnant women and 2 drinks a day for men. One drink equals 12 oz of beer, 5 oz of wine, or 1 oz of hard liquor. Lifestyle  Work with your health care provider to maintain a healthy body weight, or to lose weight. Ask what an ideal weight is for you.  Get at least 30 minutes of exercise that causes your heart to beat faster (aerobic exercise) most days of the week. Activities may include walking, swimming, or biking.  Include exercise to strengthen your muscles (  resistance exercise), such as weight lifting, as part of your weekly exercise routine. Try to do these types of exercises for 30 minutes at least 3 days a week.  Do not use any products that contain nicotine or tobacco, such as cigarettes and e-cigarettes. If you need help quitting, ask your health care provider.  Control any long-term (chronic) conditions you have, such as high cholesterol or diabetes. Monitoring  Monitor your blood pressure at home as told by your health care provider. Your personal target blood pressure may vary depending on  your medical conditions, your age, and other factors.  Have your blood pressure checked regularly, as often as told by your health care provider. Working with your health care provider  Review all the medicines you take with your health care provider because there may be side effects or interactions.  Talk with your health care provider about your diet, exercise habits, and other lifestyle factors that may be contributing to hypertension.  Visit your health care provider regularly. Your health care provider can help you create and adjust your plan for managing hypertension. Will I need medicine to control my blood pressure? Your health care provider may prescribe medicine if lifestyle changes are not enough to get your blood pressure under control, and if:  Your systolic blood pressure is 130 or higher.  Your diastolic blood pressure is 80 or higher. Take medicines only as told by your health care provider. Follow the directions carefully. Blood pressure medicines must be taken as prescribed. The medicine does not work as well when you skip doses. Skipping doses also puts you at risk for problems. Contact a health care provider if:  You think you are having a reaction to medicines you have taken.  You have repeated (recurrent) headaches.  You feel dizzy.  You have swelling in your ankles.  You have trouble with your vision. Get help right away if:  You develop a severe headache or confusion.  You have unusual weakness or numbness, or you feel faint.  You have severe pain in your chest or abdomen.  You vomit repeatedly.  You have trouble breathing. Summary  Hypertension is when the force of blood pumping through your arteries is too strong. If this condition is not controlled, it may put you at risk for serious complications.  Your personal target blood pressure may vary depending on your medical conditions, your age, and other factors. For most people, a normal blood  pressure is less than 120/80.  Hypertension is managed by lifestyle changes, medicines, or both. Lifestyle changes include weight loss, eating a healthy, low-sodium diet, exercising more, and limiting alcohol. This information is not intended to replace advice given to you by your health care provider. Make sure you discuss any questions you have with your health care provider. Document Revised: 11/22/2018 Document Reviewed: 06/28/2016 Elsevier Patient Education  [employer].

## 2020-08-04 ENCOUNTER — Other Ambulatory Visit: Payer: Self-pay | Admitting: Internal Medicine

## 2020-08-17 ENCOUNTER — Other Ambulatory Visit: Payer: Self-pay | Admitting: Internal Medicine

## 2020-08-17 DIAGNOSIS — F411 Generalized anxiety disorder: Secondary | ICD-10-CM

## 2020-08-19 ENCOUNTER — Other Ambulatory Visit: Payer: Self-pay | Admitting: Internal Medicine

## 2020-08-19 DIAGNOSIS — M47816 Spondylosis without myelopathy or radiculopathy, lumbar region: Secondary | ICD-10-CM

## 2020-08-19 NOTE — Telephone Encounter (Signed)
Requested medication (s) are due for refill today:   Provider to determine  Requested medication (s) are on the active medication list:   Yes  Future visit scheduled:   No   Last ordered: 04/15/2020 #120, 0 refills  Non delegated refill   Requested Prescriptions  Pending Prescriptions Disp Refills   traMADol (ULTRAM) 50 MG tablet [Pharmacy Med Name: tramadol 50 mg tablet] 120 tablet 0    Sig: TAKE ONE TABLET BY MOUTH EVERY 6 HOURS AS NEEDED      Not Delegated - Analgesics:  Opioid Agonists Failed - 08/19/2020 12:28 PM      Failed - This refill cannot be delegated      Failed - Urine Drug Screen completed in last 360 days      Passed - Valid encounter within last 6 months    Recent Outpatient Visits           3 months ago Generalized anxiety disorder   [place], Laura H, MD   7 months ago Essential hypertension   [facility] Glean Hess, MD   1 year ago Annual physical exam   [facility] Glean Hess, MD   2 years ago Chronic renal disease, stage III [facility])   [facility] Glean Hess, MD   2 years ago Annual physical exam   [facility] Glean Hess, MD

## 2020-08-23 NOTE — Telephone Encounter (Signed)
Called, no answer and cannot leave a message

## 2020-08-31 ENCOUNTER — Other Ambulatory Visit: Payer: Self-pay | Admitting: Internal Medicine

## 2020-08-31 DIAGNOSIS — F324 Major depressive disorder, single episode, in partial remission: Secondary | ICD-10-CM

## 2020-08-31 NOTE — Telephone Encounter (Signed)
Requested medication (s) are due for refill today: yes  Requested medication (s) are on the active medication list: yes  Last refill:  03/04/20  Future visit scheduled: no  Notes to clinic:  overdue lab work   Requested Prescriptions  Pending Prescriptions Disp Refills   mirtazapine ([place]) 15 MG tablet [Pharmacy Med Name: mirtazapine 15 mg tablet] 30 tablet 1    Sig: TAKE ONE TABLET BY MOUTH AT BEDTIME      Psychiatry: Antidepressants - mirtazapine Failed - 08/31/2020  1:44 PM      Failed - Triglycerides in normal range and within 360 days    Triglycerides  Date Value Ref Range Status  07/04/2019 74 0 - 149 mg/dL Final          Failed - Total Cholesterol in normal range and within 360 days    Cholesterol, Total  Date Value Ref Range Status  07/04/2019 160 100 - 199 mg/dL Final          Failed - WBC in normal range and within 360 days    WBC  Date Value Ref Range Status  07/04/2019 8.4 3.4 - 10.8 x10E3/uL Final          Passed - AST in normal range and within 360 days    AST  Date Value Ref Range Status  05/07/2020 14 0 - 40 IU/L Final          Passed - ALT in normal range and within 360 days    ALT  Date Value Ref Range Status  05/07/2020 11 0 - 32 IU/L Final          Passed - Completed PHQ-2 or PHQ-9 in the last 360 days      Passed - Valid encounter within last 6 months    Recent Outpatient Visits           3 months ago Generalized anxiety disorder   [place], Laura H, MD   8 months ago Essential hypertension   [place], Laura H, MD   1 year ago Annual physical exam   [facility] Glean Hess, MD   2 years ago Chronic renal disease, stage III [facility])   [facility] Glean Hess, MD   2 years ago Annual physical exam   [facility] Glean Hess, MD

## 2020-09-08 ENCOUNTER — Other Ambulatory Visit: Payer: Self-pay | Admitting: Internal Medicine

## 2020-09-08 DIAGNOSIS — F411 Generalized anxiety disorder: Secondary | ICD-10-CM

## 2020-09-08 NOTE — Telephone Encounter (Signed)
Please review. Last office visit 05/07/2020.  KP

## 2020-09-08 NOTE — Telephone Encounter (Signed)
Requested medication (s) are due for refill today: yes  Requested medication (s) are on the active medication list: no  Last refill:  07/17/20  Future visit scheduled: no  Notes to clinic:  Please review for refill. Refill not delegated per protocol    Requested Prescriptions  Pending Prescriptions Disp Refills   ALPRAZolam (XANAX) 0.25 MG tablet [Pharmacy Med Name: alprazolam 0.25 mg tablet] 45 tablet 1    Sig: TAKE ONE TABLET BY MOUTH TWICE DAILY AS NEEDED FOR ANXIETY      Not Delegated - Psychiatry:  Anxiolytics/Hypnotics Failed - 09/08/2020  1:16 PM      Failed - This refill cannot be delegated      Failed - Urine Drug Screen completed in last 360 days      Passed - Valid encounter within last 6 months    Recent Outpatient Visits           4 months ago Generalized anxiety disorder   [facility] Glean Hess, MD   8 months ago Essential hypertension   [facility] Glean Hess, MD   1 year ago Annual physical exam   [facility] Glean Hess, MD   2 years ago Chronic renal disease, stage III [facility])   [facility] Glean Hess, MD   2 years ago Annual physical exam   [facility] Glean Hess, MD

## 2020-09-09 NOTE — Telephone Encounter (Signed)
Couldn't reach patient phone rings busy.

## 2020-09-29 ENCOUNTER — Telehealth: Payer: Self-pay | Admitting: Internal Medicine

## 2020-09-29 NOTE — Telephone Encounter (Signed)
Patient's daughter would like to talk with the doctor regarding patient's medication.  She is confused about the dosage.  Please call daughter to discuss.

## 2020-09-30 ENCOUNTER — Other Ambulatory Visit: Payer: Self-pay | Admitting: Internal Medicine

## 2020-09-30 DIAGNOSIS — F411 Generalized anxiety disorder: Secondary | ICD-10-CM

## 2020-09-30 MED ORDER — ALPRAZOLAM 0.25 MG PO TABS: 0.2500 mg | ORAL_TABLET | Freq: Two times a day (BID) | ORAL | 1 refills | Status: DC | PRN

## 2020-09-30 NOTE — Telephone Encounter (Signed)
Xanax is not meant to be used to prevent panic attacks.  It is short acting and is intended to stop a panic attack once it starts.  If she is having panic attacks twice a day, she needs to be referred to a psychiatrist for medication management.  Because her husband is doing poorly, I will give her 60 tablets a day for a few months until she can be seen by psych.

## 2020-09-30 NOTE — Telephone Encounter (Signed)
Called and left VM for patients daughter informing her of this exact information per Dr. Gaspar Cola note. Told her I mailed her a copy of our recommended [place] she can schedule her mother an appt at to see psych. She will need to call her insurance company to find out where she is approved to see first.  Told her to call back if she has any further questions but that Dr. Army Melia will only give her 60 tablets for 2 months - enough time for her to try and find a psychiatrist.

## 2020-10-26 ENCOUNTER — Ambulatory Visit (INDEPENDENT_AMBULATORY_CARE_PROVIDER_SITE_OTHER): Payer: PPO | Admitting: Pharmacist

## 2020-10-26 DIAGNOSIS — I10 Essential (primary) hypertension: Secondary | ICD-10-CM | POA: Diagnosis not present

## 2020-10-26 DIAGNOSIS — N184 Chronic kidney disease, stage 4 (severe): Secondary | ICD-10-CM

## 2020-10-26 NOTE — Progress Notes (Signed)
Chronic Care Management Pharmacy Note  10/26/2020 Name:  Veronica Rowe MRN:  [id number] DOB:  01/03/1941  Subjective: Veronica Rowe is an 80 y.o. year old female who is a primary patient of Army Melia, Jesse Sans, MD.  The CCM team was consulted for assistance with disease management and care coordination needs.    Engaged with patient by telephone for follow up visit in response to provider referral for pharmacy case management and/or care coordination services.   Consent to Services:  The patient was given information about Chronic Care Management services, agreed to services, and gave verbal consent prior to initiation of services.  Please see initial visit note for detailed documentation.   Patient Care Team: Glean Hess, MD as PCP - General (Internal Medicine) Bary Castilla Forest Gleason, MD (General Surgery) Vladimir Faster, [facility] (Pharmacist)  Recent office visits: NA  Recent consult visits: [facility] visits: None in previous 6 months  Objective:  Lab Results  Component Value Date   CREATININE 1.47 (H) 05/07/2020   BUN 32 (H) 05/07/2020   GFRNONAA 34 (L) 05/07/2020   GFRAA 39 (L) 05/07/2020   NA 142 05/07/2020   K 4.4 05/07/2020   CALCIUM 9.5 05/07/2020   CO2 28 05/07/2020    No results found for: HGBA1C, FRUCTOSAMINE, GFR, MICROALBUR  Last diabetic Eye exam: No results found for: HMDIABEYEEXA  Last diabetic Foot exam: No results found for: HMDIABFOOTEX   Lab Results  Component Value Date   CHOL 160 07/04/2019   HDL 57 07/04/2019   LDLCALC 89 07/04/2019   TRIG 74 07/04/2019   CHOLHDL 2.8 07/04/2019    Hepatic Function Latest Ref Rng & Units 05/07/2020 07/04/2019 01/29/2018  Total Protein 6.0 - 8.5 g/dL 6.8 6.9 7.4  Albumin 3.7 - 4.7 g/dL 4.2 4.2 4.5  AST 0 - 40 IU/L _0 ALT 0 - 32 IU/L _1 Alk Phosphatase 44 - 121 IU/L 91 85 98  Total Bilirubin 0.0 - 1.2 mg/dL 0.3 0.2 0.5    Lab Results  Component Value Date/Time   TSH 2.620 07/04/2019  09:21 AM   TSH 0.961 01/29/2018 12:00 PM    CBC Latest Ref Rng & Units 07/04/2019 01/29/2018 08/02/2017  WBC 3.4 - 10.8 x10E3/uL 8.4 8.1 8.1  Hemoglobin 11.1 - 15.9 g/dL 14.0 13.5 13.5  Hematocrit 34.0 - 46.6 % 41.4 40.3 40.9  Platelets 150 - 450 x10E3/uL 248 268 263    Lab Results  Component Value Date/Time   VD25OH 56.8 05/07/2020 10:14 AM    Clinical ASCVD: No  The 10-year ASCVD risk score Mikey Bussing DC Jr., et al., 2013) is: 25.7%   Values used to calculate the score:     Age: 15 years     Sex: Female     Is Non-Hispanic African American: No     Diabetic: No     Tobacco smoker: No     Systolic Blood Pressure: 782 mmHg     Is BP treated: Yes     HDL Cholesterol: 57 mg/dL     Total Cholesterol: 160 mg/dL    Depression screen [facility] 2/9 05/07/2020 01/02/2020 12/31/2019  Decreased Interest 0 0 0  Down, Depressed, Hopeless 0 0 0  PHQ - 2 Score 0 0 0  Altered sleeping 0 0 -  Tired, decreased energy 0 0 -  Change in appetite 0 0 -  Feeling bad or failure about yourself  0 0 -  Trouble concentrating 0 0 -  Moving slowly or fidgety/restless 0 0 -  Suicidal thoughts 0 0 -  PHQ-9 Score 0 0 -  Difficult doing work/chores Not difficult at all Not difficult at all -  Some recent data might be hidden      Social History   Tobacco Use  Smoking Status Never Smoker  Smokeless Tobacco Never Used   BP Readings from Last 3 Encounters:  05/07/20 128/64  01/02/20 104/62  07/04/19 136/80   Pulse Readings from Last 3 Encounters:  05/07/20 83  01/02/20 89  07/04/19 78   Wt Readings from Last 3 Encounters:  05/07/20 152 lb (68.9 kg)  01/02/20 144 lb (65.3 kg)  07/04/19 150 lb (68 kg)    Assessment/Interventions: Review of patient past medical history, allergies, medications, health status, including review of consultants reports, laboratory and other test data, was performed as part of comprehensive evaluation and provision of chronic care management services.   SDOH:  (Social  Determinants of Health) assessments and interventions performed: No   CCM Care Plan  Allergies  Allergen Reactions  . Codeine Itching and Other (See Comments)    hallucinations    Medications Reviewed Today    Reviewed by Vladimir Faster, [facility] (Pharmacist) on 07/29/20 at [place] List Status: <None>  Medication Order Taking? Sig Documenting Provider Last Dose Status Informant  acetaminophen (TYLENOL) 325 MG tablet [id number] Yes Take 650 mg by mouth every 6 (six) hours as needed. [provider] Taking Active   ALPRAZolam Duanne Moron) 0.25 MG tablet [id number] Yes TAKE ONE TABLET BY MOUTH TWICE DAILY AS NEEDED FOR ANXIETY Glean Hess, MD Taking Active   betamethasone dipropionate (DIPROLENE) 0.05 % cream [id number] No Apply topically 2 (two) times daily.  Patient not taking: Reported on 07/29/2020   Glean Hess, MD Not Taking Active   cholecalciferol (VITAMIN D) 1000 UNITS tablet [id number] Yes Take 1,000 Units by mouth daily. [provider] Taking Active   fluticasone (FLONASE) 50 MCG/ACT nasal spray [id number] Yes Place 2 sprays into both nostrils daily. [provider] Taking Active            Med Note Valere Dross   Tue Jan 29, 2018 11:25 AM)    losartan (COZAAR) 100 MG tablet [id number] Yes TAKE ONE TABLET BY MOUTH ONCE DAILY Glean Hess, MD Taking Active   mirtazapine (REMERON) 15 MG tablet [id number] Yes TAKE ONE TABLET BY MOUTH AT BEDTIME Glean Hess, MD Taking Active   omeprazole (PRILOSEC) 20 MG capsule [id number] Yes TAKE ONE CAPSULE BY MOUTH TWICE DAILY  Patient taking differently: daily.   Glean Hess, MD Taking Active   sertraline (ZOLOFT) 100 MG tablet [id number] Yes TAKE ONE TABLET BY MOUTH ONCE DAILY Glean Hess, MD Taking Active   simvastatin (ZOCOR) 20 MG tablet [id number] Yes TAKE ONE TABLET BY MOUTH EVERY Rockne Menghini, MD Taking Active   traMADol (ULTRAM) 50 MG tablet [id number] Yes TAKE ONE TABLET BY  MOUTH EVERY 6 HOURS AS NEEDED Glean Hess, MD Taking Active   triamterene-hydrochlorothiazide [facility]) 37.5-25 MG tablet [id number] Yes TAKE ONE TABLET BY MOUTH ONCE DAILY Glean Hess, MD Taking Active           Patient Active Problem List   Diagnosis Date Noted  . Osteoarthritis of fingers of both hands 01/02/2020  . Lumbar spondylosis 03/27/2016  . Vitamin D deficiency 08/17/2015  . Essential hypertension 07/07/2015  . Major depressive disorder with single episode, in partial remission (  [place]) 07/07/2015  . CKD (chronic kidney disease) stage 4, GFR 15-29 ml/min (HCC) 06/26/2015  . Hx of peptic ulcer 06/26/2015  . Generalized psoriasis 06/26/2015  . Stress incontinence, female 06/26/2015  . Dyslipidemia 06/26/2015  . Generalized anxiety disorder 06/26/2015    Immunization History  Administered Date(s) Administered  . Fluad Quad(high Dose 65+) 07/04/2019  . Pneumococcal Conjugate-13 01/04/2016  . Pneumococcal Polysaccharide-23 10/05/2011  . Tdap 10/28/2013    Conditions to be addressed/monitored:  Hypertension, Hyperlipidemia, Chronic Kidney Disease, Depression, Anxiety, Osteoarthritis and lumbar spondylosis and h/o peptic ulcer  Care Plan : CCM pharmacy Care Plan  Updates made by Vladimir Faster, [place] since 10/26/2020 12:00 AM    Problem: HTN, CKD, HLD, Depression, Anxiety   Priority: High    Long-Range Goal: Disease Management   Start Date: 10/26/2020  This Visit's Progress: On track  Priority: High  Note:   Current Barriers:  . Unable to independently monitor therapeutic efficacy . Suboptimal therapeutic regimen for CKD/HTN . Does not maintain contact with provider office . Spouse health deteriorating  Pharmacist Clinical Goal(s):  Marland Kitchen Patient will achieve adherence to monitoring guidelines and medication adherence to achieve therapeutic efficacy . adhere to plan to optimize therapeutic regimen for htn/ckd as evidenced by report of adherence to  recommended medication management changes . adhere to prescribed medication regimen as evidenced by fill dates through collaboration with PharmD and provider.  . Schedule follow up appointment with neephrology  Interventions: . 1:1 collaboration with Glean Hess, MD regarding development and update of comprehensive plan of care as evidenced by provider attestation and co-signature . Inter-disciplinary care team collaboration (see longitudinal plan of care) . Comprehensive medication review performed; medication list updated in electronic medical record BMP Latest Ref Rng & Units 05/07/2020 01/02/2020 07/04/2019  Glucose 65 - 99 mg/dL 89 94 79  BUN 8 - 27 mg/dL 32(H) 30(H) 31(H)  Creatinine 0.57 - 1.00 mg/dL 1.47(H) 1.81(H) 1.32(H)  BUN/Creat Ratio 12 - _0 Sodium 134 - 144 mmol/L 142 139 141  Potassium 3.5 - 5.2 mmol/L 4.4 4.5 4.0  Chloride 96 - 106 mmol/L 100 99 100  CO2 20 - 29 mmol/L _1 Calcium 8.7 - 10.3 mg/dL 9.5 9.9 9.4    Hypertension/CKD (BP goal <130/80) -Controlled -Current treatment:  Losartan 100 mg qd  Trimaterene/hctz 37.5/25 mg qd -Medications previously tried: NA  -Current home readings: patient can not give any readings states Angie is taking and it's "good" -Denies hypotensive/hypertensive symptoms -Educated on BP goals and benefits of medications for prevention of heart attack, stroke and kidney damage; Daily salt intake goal < 2300 mg; Exercise goal of 150 minutes per week; -Counseled to monitor BP at home 2-3 times weekly, document, and provide log at future appointments -Counseled on diet and exercise extensively Recommended patient consider return to nephrology  Hyperlipidemia: (LDL goal < 70) -Not ideally controlled -Current treatment: . Simvastatin 20 mg qd -Medications previously tried: NA  -current dieatry patterns: daughter cooks most meals, avoids added salt  -Educated on Cholesterol goals;  Benefits of statin for ASCVD  risk reduction; Exercise goal of 150 minutes per week; -Counseled on diet and exercise extensively Recommended consider changing to high intensity statin given ASCVD risk  Depression/Anxiety (Goal: alleviation of symptoms) -Not ideally controlled -Current treatment: . Alprazolam 0.25 mg bid  (only approved for 2 months) . Sertraline 100 mg qd -Medications previously tried/failed: NA -PHQ9:  PHQ9 SCORE ONLY 05/07/2020 01/02/2020 12/31/2019  PHQ-9 Total  Score 0 0 0    -GAD7:  GAD 7 : Generalized Anxiety Score 05/07/2020 01/02/2020  Nervous, Anxious, on Edge 0 0  Control/stop worrying 0 0  Worry too much - different things 0 0  Trouble relaxing 0 0  Restless 0 0  Easily annoyed or irritable 0 0  Afraid - awful might happen 0 0  Total GAD 7 Score 0 0  Anxiety Difficulty Not difficult at all Not difficult at all     -Referenced mailed to home  for mental health support. Patient unaware of any scheduled appointments. Patient states she is no longer having panic attacks ot taking xanax twice daily -Educated on Benefits of medication for symptom control Benefits of cognitive-behavioral therapy with or without medication -Recommended patient seek psych support  Will refer to social work  Lumbar spondylosis (Goal: pain level ) -Controlled -Current treatment  . Tramadol 50 mg q6 h prn . Acetaminophen 325 mg prn -Medications previously tried: NA  -Counseled on diet and exercise extensively Recommended to continue current medication Counseled on avoiding NSAIDS and max dose of 3000 mg acetaminophen daily  Health Maintenance -Vaccine gaps: COVID Vaccine -Current therapy:  Marland Kitchen Vitamin c 500 mg qd . Vitamin d 1000 u qd . Zinc gluconate 50 mg qd -Educated on Supplements may interfere with prescription drugs -Patient is satisfied with current therapy and denies issues -Recommended to continue current medication Recommended consider addition of calcium supplementation   Patient  Goals/Self-Care Activities . Patient will:  - take medications as prescribed check blood pressure 2-3 times weekly, document, and provide at future appointments target a minimum of 150 minutes of moderate intensity exercise weekly Schedule appointment with nephrology and psych  Follow Up Plan: Face to Face appointment with care management team member scheduled for:          Medication Assistance: None required.  Patient affirms current coverage meets needs.  Patient's preferred pharmacy is:  [facility] [place] [facility], [place], [place] - [address] [place] [address] Phone: [phone] Fax: [place], [place] - [address] [place] [address] Phone: [phone] Fax: [phone]  Uses pill box? Yes-- daughter manages meds Pt endorses 100% compliance  We discussed: Benefits of medication synchronization, packaging and delivery as well as enhanced pharmacist oversight with [employer]. Patient decided to: Continue current medication management strategy  Care Plan and Follow Up Patient Decision:  Patient agrees to Care Plan and Follow-up.  Plan: Telephone follow up appointment with care management team member scheduled for:  3 months  Junita Push. Kenton Kingfisher PharmD, [facility] [phone]

## 2020-10-26 NOTE — Patient Instructions (Addendum)
Visit Information  It was a pleasure speaking with you today. Thank you for letting me be part of your clinical team. Please call with any questions or concerns.   Goals Addressed            This Visit's Progress   . Follow My Treatment Plan-Chronic Kidney       Timeframe:  Long-Range Goal Priority:  High Start Date:                             Expected End Date:                       Follow Up Date 3 moth follow up visit - ask for help if I can't afford my medicines - call for medicine refill 2 or 3 days before it runs out - call the doctor or nurse before I stop taking medicine - keep follow-up appointments    Why is this important?    Staying as healthy as you can is very important. This may mean making changes if you smoke, don't exercise or eat poorly.   A healthy lifestyle is an important goal for you.   Following the treatment plan and making changes may be hard.   Try some of these steps to help keep the disease from getting worse.     Notes:     . Track and Manage My Blood Pressure-Hypertension       Timeframe:  Long-Range Goal Priority:  High Start Date:                             Expected End Date:                       Follow Up Date 3 month follow up    - check blood pressure 3 times per week - write blood pressure results in a log or diary    Why is this important?    You won't feel high blood pressure, but it can still hurt your blood vessels.   High blood pressure can cause heart or kidney problems. It can also cause a stroke.   Making lifestyle changes like losing a little weight or eating less salt will help.   Checking your blood pressure at home and at different times of the day can help to control blood pressure.   If the doctor prescribes medicine remember to take it the way the doctor ordered.   Call the office if you cannot afford the medicine or if there are questions about it.     Notes:        The patient verbalized  understanding of instructions, educational materials, and care plan provided today and declined offer to receive copy of patient instructions, educational materials, and care plan.   The pharmacy team will reach out to the patient again over the next 60 days.   Junita Push. Kenton Kingfisher PharmD, BCPS Clinical Pharmacist [phone]  Chronic Kidney Disease, Adult Chronic kidney disease is when lasting damage happens to the kidneys slowly over a long time. The kidneys help to:  Make pee (urine).  Make hormones.  Keep the right amount of fluids and chemicals in the body. Most often, this disease does not go away. You must take steps to help keep the kidney damage from getting worse. If steps are not taken,  the kidneys might stop working forever. What are the causes?  Diabetes.  High blood pressure.  Diseases that affect the heart and blood vessels.  Other kidney diseases.  Diseases of the body's disease-fighting system.  A problem with the flow of pee.  Infections of the organs that make pee, store it, and take it out of the body.  Swelling or irritation of your blood vessels. What increases the risk?  Getting older.  Having someone in your family who has kidney disease or kidney failure.  Having a disease caused by genes.  Taking medicines often that harm the kidneys.  Being near or having contact with harmful substances.  Being very overweight.  Using tobacco now or in the past. What are the signs or symptoms?  Feeling very tired.  Having a swollen face, legs, ankles, or feet.  Feeling like you may vomit or vomiting.  Not feeling hungry.  Being confused or not able to focus.  Twitches and cramps in the leg muscles or other muscles.  Dry, itchy skin.  A taste of metal in your mouth.  Making less pee, or making more pee.  Shortness of breath.  Trouble sleeping. You may also become anemic or get weak bones. Anemic means there is not enough red blood cells or  hemoglobin in your blood. You may get symptoms slowly. You may not notice them until the kidney damage gets very bad. How is this treated? Often, there is no cure for this disease. Treatment can help with symptoms and help keep the disease from getting worse. You may need to:  Avoid alcohol.  Avoid foods that are high in salt, potassium, phosphorous, and protein.  Take medicines for symptoms and to help control other conditions.  Have dialysis. This treatment gets harmful waste out of your body.  Treat other problems that cause your kidney disease or make it worse. Follow these instructions at home: Medicines  Take over-the-counter and prescription medicines only as told by your doctor.  Do not take any new medicines, vitamins, or supplements unless your doctor says it is okay. Lifestyle  Do not smoke or use any products that contain nicotine or tobacco. If you need help quitting, ask your doctor.  If you drink alcohol: ? Limit how much you use to:  0-1 drink a day for women who are not pregnant.  0-2 drinks a day for men. ? Know how much alcohol is in your drink. In the [place] one drink equals one 12 oz bottle of beer (355 mL), one 5 oz glass of wine (148 mL), or one 1 oz glass of hard liquor (44 mL).  Stay at a healthy weight. If you need help losing weight, ask your doctor.   General instructions  Follow instructions from your doctor about what you cannot eat or drink.  Track your blood pressure at home. Tell your doctor about any changes.  If you have diabetes, track your blood sugar.  Exercise at least 30 minutes a day, 5 days a week.  Keep your shots (vaccinations) up to date.  Keep all follow-up visits.   Where to find more information  American Association of Kidney Patients: BombTimer.gl  National Kidney Foundation: www.kidney.[place]: https://mathis.com/  Life Options: www.lifeoptions.org  Kidney School: www.kidneyschool.org Contact a  doctor if:  Your symptoms get worse.  You get new symptoms. Get help right away if:  You get symptoms of end-stage kidney disease. These include: ? Headaches. ? Losing feeling in your hands  or feet. ? Easy bruising. ? Having hiccups often. ? Chest pain. ? Shortness of breath. ? Lack of menstrual periods, in women.  You have a fever.  You make less pee than normal.  You have pain or you bleed when you pee or poop. These symptoms may be an emergency. Get help right away. Call your local emergency services (911 in the [place]  Do not wait to see if the symptoms will go away.  Do not drive yourself to the hospital. Summary  Chronic kidney disease is when lasting damage happens to the kidneys slowly over a long time.  Causes of this disease include diabetes and high blood pressure.  Often, there is no cure for this disease. Treatment can help symptoms and help keep the disease from getting worse.  Treatment may involve lifestyle changes, medicines, and dialysis. This information is not intended to replace advice given to you by your health care provider. Make sure you discuss any questions you have with your health care provider. Document Revised: 11/05/2019 Document Reviewed: 11/05/2019 Elsevier Patient Education  [place].

## 2020-10-27 ENCOUNTER — Other Ambulatory Visit: Payer: Self-pay | Admitting: Internal Medicine

## 2020-10-27 DIAGNOSIS — F324 Major depressive disorder, single episode, in partial remission: Secondary | ICD-10-CM

## 2020-10-27 NOTE — Telephone Encounter (Signed)
Requested Prescriptions  Pending Prescriptions Disp Refills  . mirtazapine (REMERON) 15 MG tablet [Pharmacy Med Name: mirtazapine 15 mg tablet] 30 tablet 1    Sig: TAKE ONE TABLET BY MOUTH AT BEDTIME     Psychiatry: Antidepressants - mirtazapine Failed - 10/27/2020  9:54 AM      Failed - Triglycerides in normal range and within 360 days    Triglycerides  Date Value Ref Range Status  07/04/2019 74 0 - 149 mg/dL Final         Failed - Total Cholesterol in normal range and within 360 days    Cholesterol, Total  Date Value Ref Range Status  07/04/2019 160 100 - 199 mg/dL Final         Failed - WBC in normal range and within 360 days    WBC  Date Value Ref Range Status  07/04/2019 8.4 3.4 - 10.8 x10E3/uL Final         Passed - AST in normal range and within 360 days    AST  Date Value Ref Range Status  05/07/2020 14 0 - 40 IU/L Final         Passed - ALT in normal range and within 360 days    ALT  Date Value Ref Range Status  05/07/2020 11 0 - 32 IU/L Final         Passed - Completed PHQ-2 or PHQ-9 in the last 360 days      Passed - Valid encounter within last 6 months    Recent Outpatient Visits          5 months ago Generalized anxiety disorder   [place], Laura H, MD   9 months ago Essential hypertension   [place], Laura H, MD   1 year ago Annual physical exam   [facility] Glean Hess, MD   2 years ago Chronic renal disease, stage III [facility])   [facility] Glean Hess, MD   2 years ago Annual physical exam   [facility] Glean Hess, MD

## 2020-11-11 ENCOUNTER — Other Ambulatory Visit: Payer: Self-pay | Admitting: Internal Medicine

## 2020-11-11 DIAGNOSIS — M47816 Spondylosis without myelopathy or radiculopathy, lumbar region: Secondary | ICD-10-CM

## 2020-11-11 MED ORDER — TRAMADOL HCL 50 MG PO TABS
50.0000 mg | ORAL_TABLET | Freq: Every day | ORAL | 0 refills | Status: DC | PRN
Start: 2020-11-11 — End: 2020-12-15

## 2020-11-11 MED ORDER — TRAMADOL HCL 50 MG PO TABS: 50.0000 mg | ORAL_TABLET | Freq: Every day | ORAL | 0 refills | Status: DC | PRN

## 2020-11-11 NOTE — Telephone Encounter (Signed)
Requested medication (s) are due for refill today: no  Requested medication (s) are on the active medication list:  yes  Last refill:  08/23/2020  Future visit scheduled: no  Notes to clinic: this refill cannot be delegated  Patient do for follow up appt Message left to contact office   Requested Prescriptions  Pending Prescriptions Disp Refills   traMADol (ULTRAM) 50 MG tablet [Pharmacy Med Name: tramadol 50 mg tablet] 120 tablet 0    Sig: TAKE ONE TABLET BY MOUTH EVERY 6 HOURS AS NEEDED      Not Delegated - Analgesics:  Opioid Agonists Failed - 11/11/2020 12:37 PM      Failed - This refill cannot be delegated      Failed - Urine Drug Screen completed in last 360 days      Failed - Valid encounter within last 6 months    Recent Outpatient Visits           6 months ago Generalized anxiety disorder   [place], Laura H, MD   10 months ago Essential hypertension   [place], Laura H, MD   1 year ago Annual physical exam   [facility] Glean Hess, MD   2 years ago Chronic renal disease, stage III [facility])   [facility] Glean Hess, MD   2 years ago Annual physical exam   [facility] Glean Hess, MD                  simvastatin (ZOCOR) 20 MG tablet [Pharmacy Med Name: simvastatin 20 mg tablet] 30 tablet 1    Sig: TAKE ONE TABLET BY MOUTH EVERY EVENING      Cardiovascular:  Antilipid - Statins Failed - 11/11/2020 12:37 PM      Failed - Total Cholesterol in normal range and within 360 days    Cholesterol, Total  Date Value Ref Range Status  07/04/2019 160 100 - 199 mg/dL Final          Failed - LDL in normal range and within 360 days    LDL Chol Calc (NIH)  Date Value Ref Range Status  07/04/2019 89 0 - 99 mg/dL Final          Failed - HDL in normal range and within 360 days    HDL  Date Value Ref Range Status  07/04/2019 57 >39 mg/dL Final          Failed -  Triglycerides in normal range and within 360 days    Triglycerides  Date Value Ref Range Status  07/04/2019 74 0 - 149 mg/dL Final          Passed - Patient is not pregnant      Passed - Valid encounter within last 12 months    Recent Outpatient Visits           6 months ago Generalized anxiety disorder   [place], Laura H, MD   10 months ago Essential hypertension   [place], Laura H, MD   1 year ago Annual physical exam   [facility] Glean Hess, MD   2 years ago Chronic renal disease, stage III [facility])   [facility] Glean Hess, MD   2 years ago Annual physical exam   [facility] Glean Hess, MD

## 2020-11-11 NOTE — Telephone Encounter (Signed)
Pt will call back to set up appt

## 2020-11-11 NOTE — Telephone Encounter (Signed)
Please call pt to schedule an appt for HTN and depression.  KP

## 2020-11-15 ENCOUNTER — Telehealth: Payer: Self-pay

## 2020-11-15 NOTE — Telephone Encounter (Signed)
Patient approved for medication from 11/15/20 - 05/17/2021.  Clista Bernhardt, CMA (AAMA)

## 2020-11-15 NOTE — Telephone Encounter (Signed)
Completed PA for Tramadol 50 mg on [email or web address].   Your information has been submitted to [facility]. To check for an updated outcome later, reopen this PA request from your dashboard.  If [employer] has not responded to your request within 24 hours, contact [place] at [phone]. If you think there may be a problem with your PA request, use our live chat feature at the bottom right.  (Key: [id number])  Awaiting outcome .

## 2020-11-16 ENCOUNTER — Other Ambulatory Visit: Payer: Self-pay | Admitting: Internal Medicine

## 2020-11-17 IMAGING — MG DIGITAL SCREENING BILAT W/ TOMO W/ CAD
6 of 10 series · 6 of 30 positions shown · non-contrast
Comparison: Previous exam(s).

CLINICAL DATA: Screening.

EXAM:
DIGITAL SCREENING BILATERAL MAMMOGRAM WITH TOMO AND CAD

[L MLO synth-2D (1 of 2)]
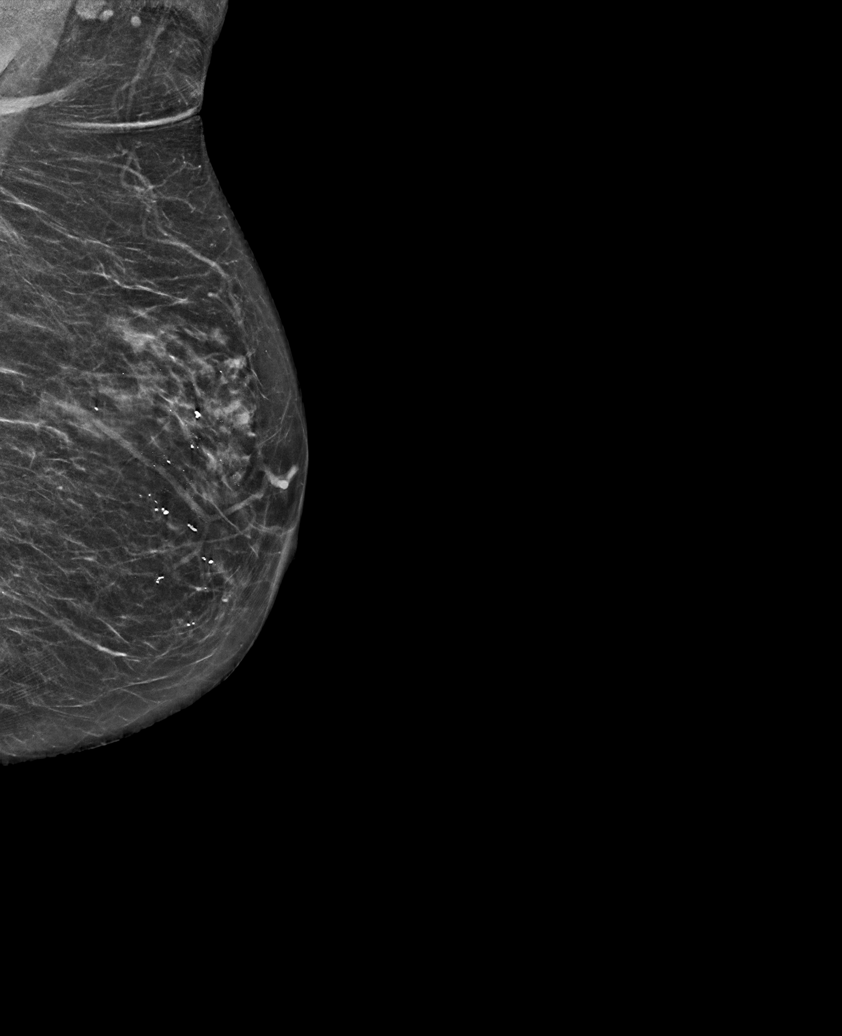

[R CC synth-2D]
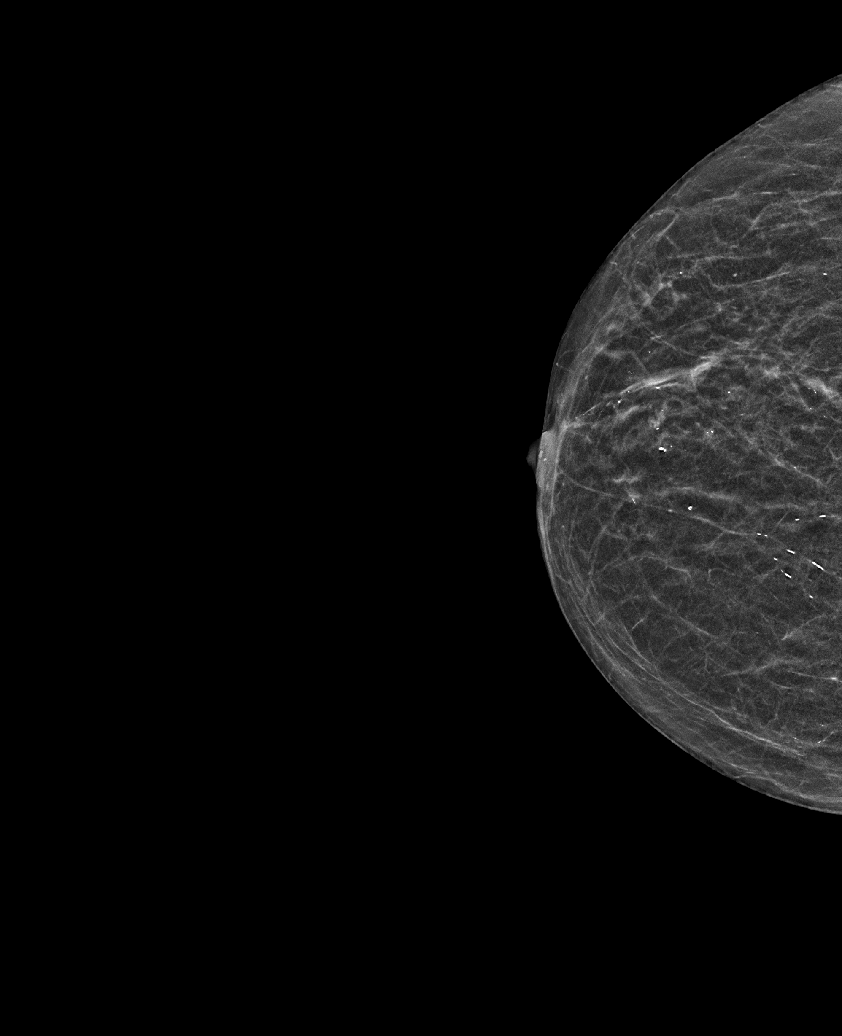

[L MLO synth-2D (2 of 2)]
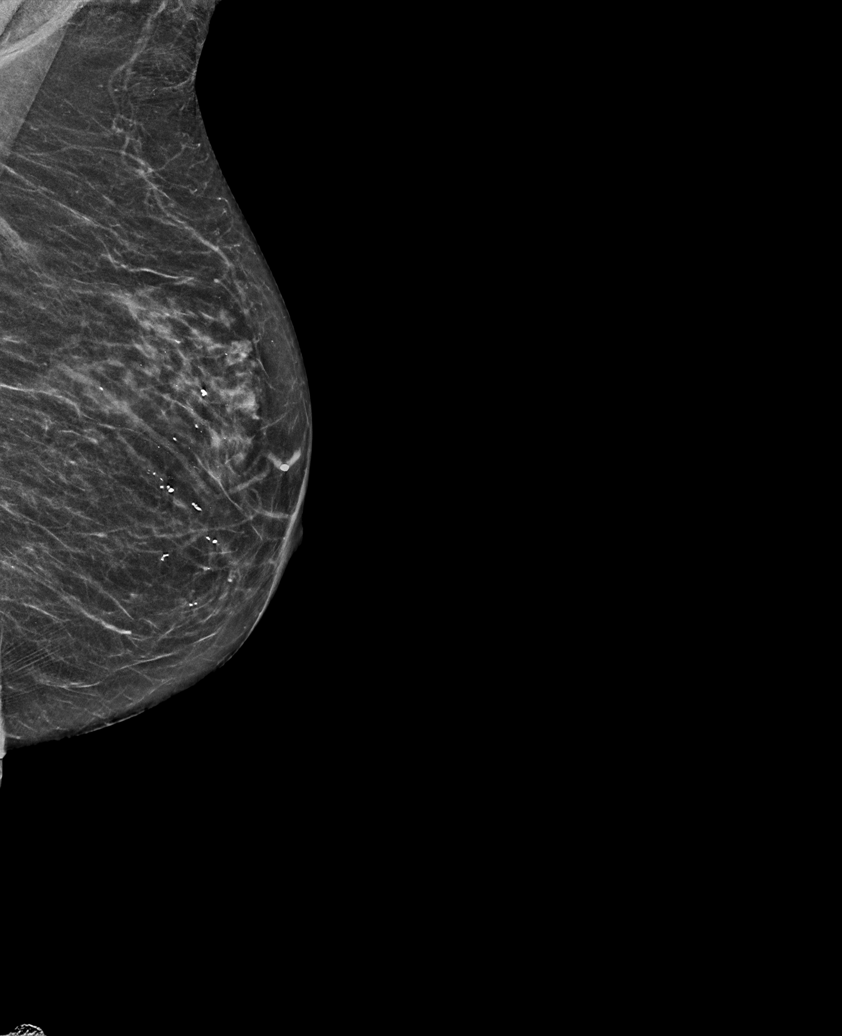

[R MLO synth-2D]
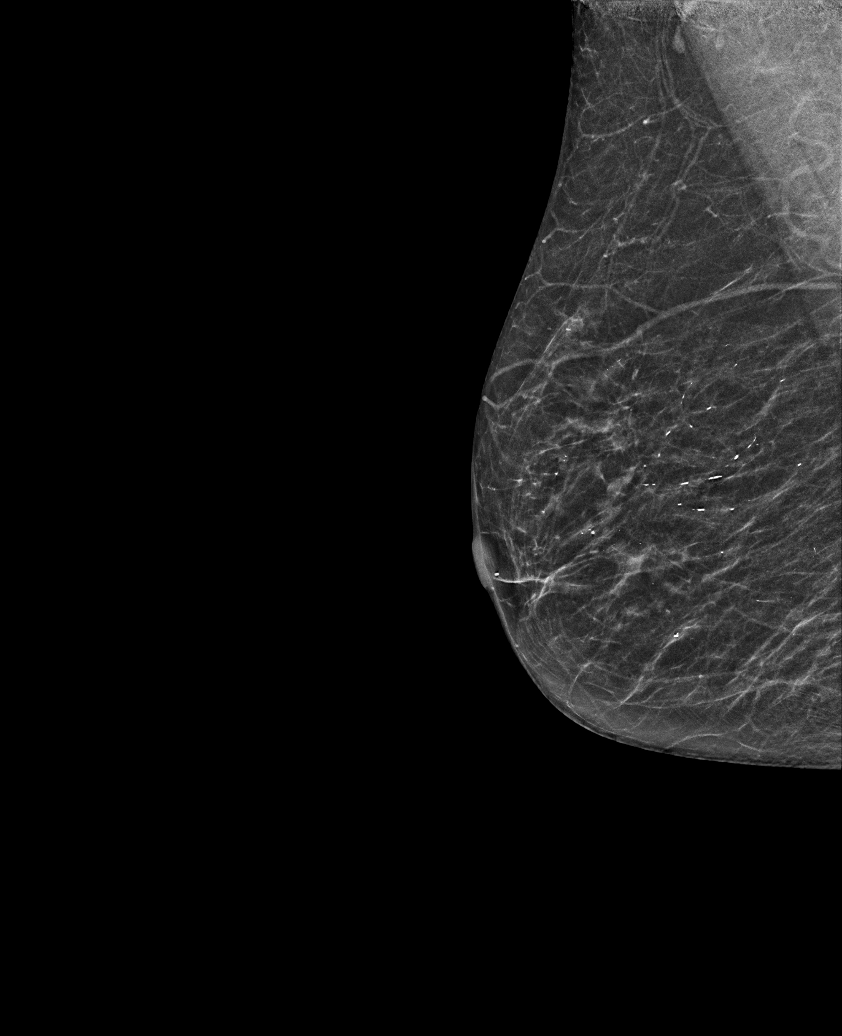

[L CC synth-2D]
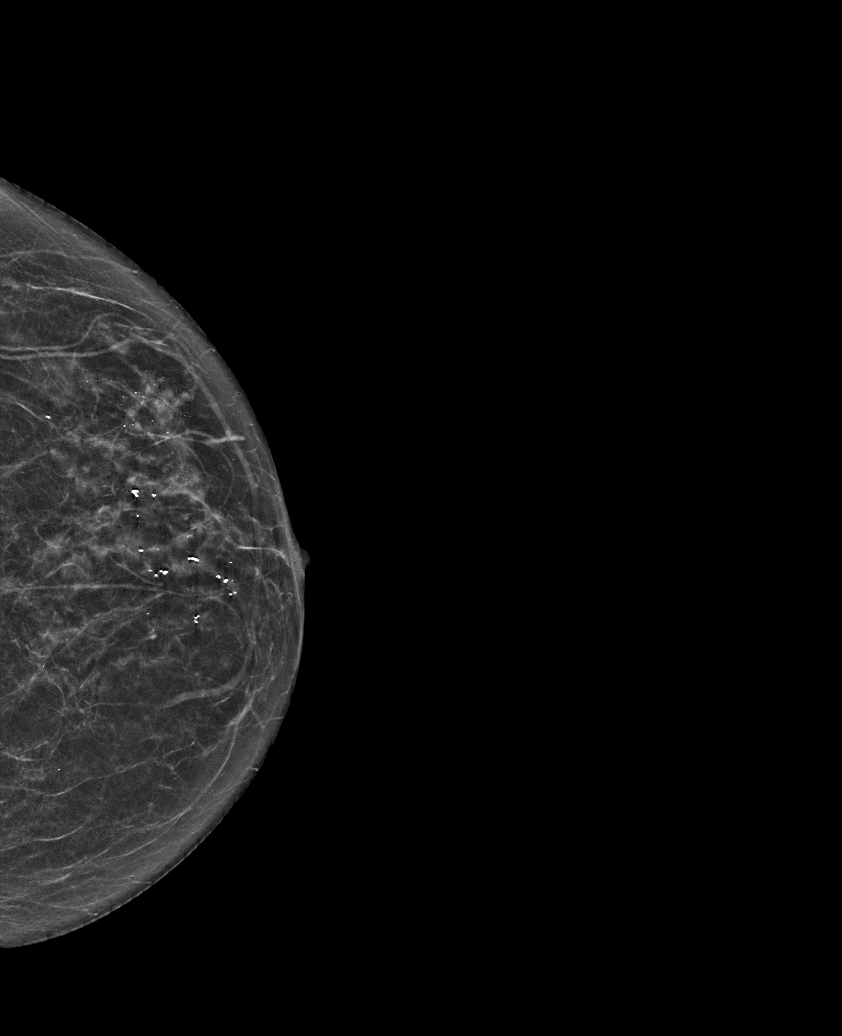

[L MLO tomo · tomo slice 26/51.0]
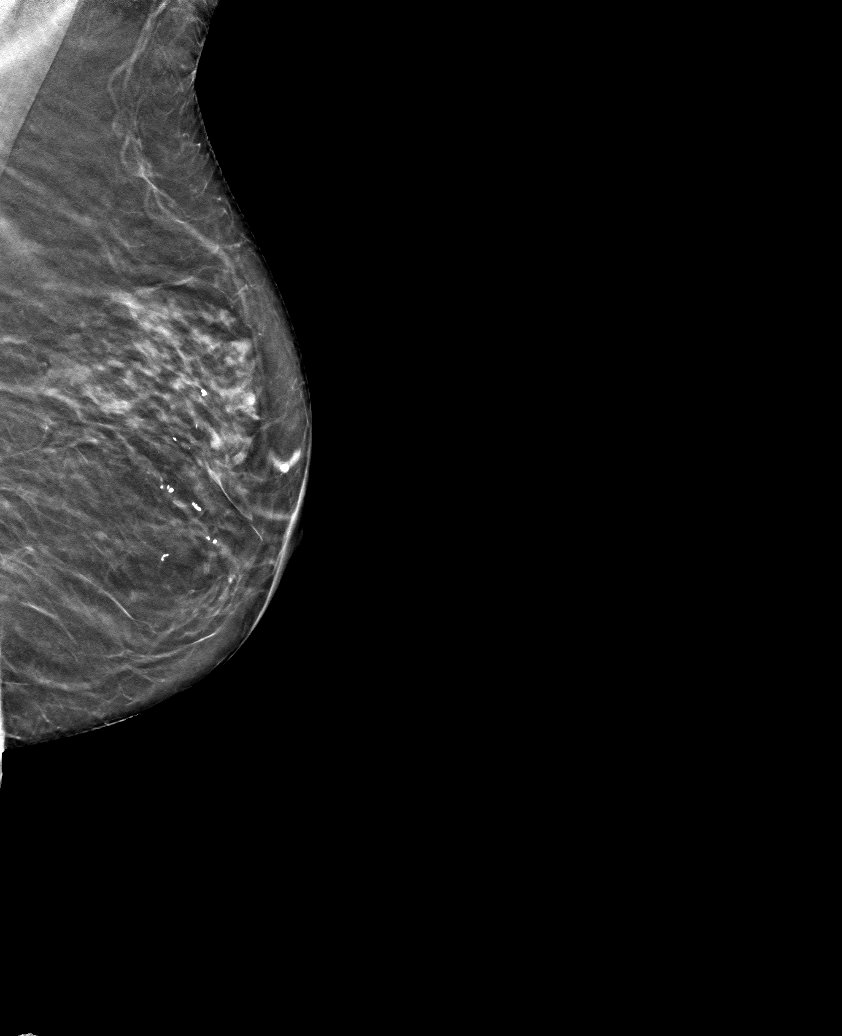

[6 of 30 positions shown; findings below may reference images not displayed]

ACR Breast Density Category b: There are scattered areas of
fibroglandular density.
FINDINGS: There are no findings suspicious for malignancy. Images were
processed with CAD.
IMPRESSION: No mammographic evidence of malignancy. A result letter of this
screening mammogram will be mailed directly to the patient.

RECOMMENDATION:
Screening mammogram in one year. (Code:[id number])

BI-RADS CATEGORY  1: Negative.

## 2020-11-24 ENCOUNTER — Other Ambulatory Visit: Payer: Self-pay | Admitting: Internal Medicine

## 2020-11-24 DIAGNOSIS — F411 Generalized anxiety disorder: Secondary | ICD-10-CM

## 2020-11-24 NOTE — Telephone Encounter (Signed)
Requested medication (s) are due for refill today: no  Requested medication (s) are on the active medication list: yes  Last refill:  11/24/2020  Future visit scheduled: no  Notes to clinic:  this refill cannot be delegated    Requested Prescriptions  Pending Prescriptions Disp Refills   ALPRAZolam (XANAX) 0.25 MG tablet [Pharmacy Med Name: alprazolam 0.25 mg tablet] 60 tablet 1    Sig: TAKE ONE TABLET BY MOUTH TWICE DAILY AS NEEDED FOR ANXIETY      Not Delegated - Psychiatry:  Anxiolytics/Hypnotics Failed - 11/24/2020 10:53 AM      Failed - This refill cannot be delegated      Failed - Urine Drug Screen completed in last 360 days      Failed - Valid encounter within last 6 months    Recent Outpatient Visits           6 months ago Generalized anxiety disorder   Coin, MD   10 months ago Essential hypertension   Haigler, Laura H, MD   1 year ago Annual physical exam   [facility] Glean Hess, MD   2 years ago Chronic renal disease, stage III [facility])   [facility] Glean Hess, MD   2 years ago Annual physical exam   [facility] Glean Hess, MD

## 2020-12-09 ENCOUNTER — Other Ambulatory Visit: Payer: Self-pay | Admitting: Internal Medicine

## 2020-12-09 DIAGNOSIS — M47816 Spondylosis without myelopathy or radiculopathy, lumbar region: Secondary | ICD-10-CM

## 2020-12-09 NOTE — Telephone Encounter (Signed)
Please schedule follow-up with Dr. Army Melia for future refills.

## 2020-12-09 NOTE — Telephone Encounter (Signed)
Refill if appropriate.  Please advise. Last filled 11/15/2020.

## 2020-12-09 NOTE — Telephone Encounter (Signed)
Requested medication (s) are due for refill today:   Provider to review  Requested medication (s) are on the active medication list:   Yes  Future visit scheduled:   No   Last ordered: 11/11/2020 #30, 0 refills  Non delegated refill    Requested Prescriptions  Pending Prescriptions Disp Refills   traMADol (ULTRAM) 50 MG tablet [Pharmacy Med Name: tramadol 50 mg tablet] 30 tablet 0    Sig: TAKE ONE TABLET BY MOUTH ONCE DAILY AS NEEDED FOR SEVERE PAIN      Not Delegated - Analgesics:  Opioid Agonists Failed - 12/09/2020  9:37 AM      Failed - This refill cannot be delegated      Failed - Urine Drug Screen completed in last 360 days      Failed - Valid encounter within last 6 months    Recent Outpatient Visits           7 months ago Generalized anxiety disorder   [place], Laura H, MD   11 months ago Essential hypertension   [place]in, Laura H, MD   1 year ago Annual physical exam   [facility] Glean Hess, MD   2 years ago Chronic renal disease, stage III [facility])   [facility] Glean Hess, MD   2 years ago Annual physical exam   [facility] Glean Hess, MD

## 2020-12-14 ENCOUNTER — Other Ambulatory Visit: Payer: Self-pay | Admitting: Internal Medicine

## 2020-12-14 DIAGNOSIS — M47816 Spondylosis without myelopathy or radiculopathy, lumbar region: Secondary | ICD-10-CM

## 2020-12-14 NOTE — Telephone Encounter (Signed)
   Notes to clinic: Patient has appt on 12/24/2020 Review for refill until that time   Requested Prescriptions  Pending Prescriptions Disp Refills   traMADol (ULTRAM) 50 MG tablet [Pharmacy Med Name: tramadol 50 mg tablet] 30 tablet 0    Sig: TAKE ONE TABLET BY MOUTH ONCE DAILY AS NEEDED FOR SEVERE PAIN      Not Delegated - Analgesics:  Opioid Agonists Failed - 12/14/2020  9:28 AM      Failed - This refill cannot be delegated      Failed - Urine Drug Screen completed in last 360 days      Failed - Valid encounter within last 6 months    Recent Outpatient Visits           7 months ago Generalized anxiety disorder   [facility], MD   11 months ago Essential hypertension   [facility] Glean Hess, MD   1 year ago Annual physical exam   [facility] Glean Hess, MD   2 years ago Chronic renal disease, stage III [facility])   [facility] Glean Hess, MD   2 years ago Annual physical exam   [facility] Glean Hess, MD       Future Appointments             In 1 week Army Melia Jesse Sans, MD [facility]              Signed Prescriptions Disp Refills   omeprazole (PRILOSEC) 20 MG capsule 60 capsule 0    Sig: TAKE ONE CAPSULE BY MOUTH TWICE DAILY      Gastroenterology: Proton Pump Inhibitors Passed - 12/14/2020  9:28 AM      Passed - Valid encounter within last 12 months    Recent Outpatient Visits           7 months ago Generalized anxiety disorder   [facility] Glean Hess, MD   11 months ago Essential hypertension   [facility] Glean Hess, MD   1 year ago Annual physical exam   [facility] Glean Hess, MD   2 years ago Chronic renal disease, stage III [facility])   [facility] Glean Hess, MD   2 years ago Annual physical exam   [facility] Glean Hess, MD       Future Appointments              In 1 week Army Melia Jesse Sans, MD [facility]

## 2020-12-21 ENCOUNTER — Telehealth: Payer: Self-pay | Admitting: Pharmacist

## 2020-12-21 NOTE — Chronic Care Management (AMB) (Signed)
    Chronic Care Management Pharmacy Assistant   Name: Veronica Rowe  MRN: [id number] DOB: [birth year]   Reason for Encounter: Disease State-General    Recent office visits:  None noted  Recent consult visits:  None noted  Hospital visits:  None in previous 6 months  Medications: Outpatient Encounter Medications as of 12/21/2020  Medication Sig  . traMADol (ULTRAM) 50 MG tablet TAKE ONE TABLET BY MOUTH ONCE DAILY AS NEEDED FOR SEVERE PAIN  . acetaminophen (TYLENOL) 325 MG tablet Take 650 mg by mouth every 6 (six) hours as needed.  . Ascorbic Acid (VITAMIN C) 500 MG CAPS Take 500 mg by mouth daily.  . betamethasone dipropionate (DIPROLENE) 0.05 % cream Apply topically 2 (two) times daily. (Patient not taking: Reported on 07/29/2020)  . cholecalciferol (VITAMIN D) 1000 UNITS tablet Take 1,000 Units by mouth daily.  . fluticasone (FLONASE) 50 MCG/ACT nasal spray Place 2 sprays into both nostrils daily.  Marland Kitchen losartan (COZAAR) 100 MG tablet TAKE ONE TABLET BY MOUTH ONCE DAILY  . mirtazapine (REMERON) 15 MG tablet TAKE ONE TABLET BY MOUTH AT BEDTIME  . omeprazole (PRILOSEC) 20 MG capsule TAKE ONE CAPSULE BY MOUTH TWICE DAILY  . sertraline (ZOLOFT) 100 MG tablet TAKE ONE TABLET BY MOUTH ONCE DAILY  . simvastatin (ZOCOR) 20 MG tablet TAKE ONE TABLET BY MOUTH EVERY EVENING  . triamterene-hydrochlorothiazide (MAXZIDE-25) 37.5-25 MG tablet TAKE ONE TABLET BY MOUTH ONCE DAILY  . zinc gluconate 50 MG tablet Take 50 mg by mouth daily.   No facility-administered encounter medications on file as of 12/21/2020.   Have you had any problems recently with your health? Patient states she is doing good and has no concerns. She states she has an appointment on Friday with her Primary care.  Have you had any problems with your pharmacy? Patient states she does not have any problems with her pharmacy.  What issues or side effects are you having with your medications? Patient states she is not  experiencing any side effects that she knows of.  What would you like me to pass along to [facility] for them to help you with?  Patient states there is nothing at this time  What can we do to take care of you better? Patient states there is nothing at time.  Star Rating Drugs: Losartan 100 mg last filled 11/25/20 30 DS Simvastatin 20 mg last filled 12/09/20 30 DS  [facility] Clinical Pharmacist Assistant [phone]

## 2020-12-24 ENCOUNTER — Other Ambulatory Visit: Payer: Self-pay | Admitting: Internal Medicine

## 2020-12-24 ENCOUNTER — Encounter: Payer: Self-pay | Admitting: Internal Medicine

## 2020-12-24 ENCOUNTER — Ambulatory Visit (INDEPENDENT_AMBULATORY_CARE_PROVIDER_SITE_OTHER): Payer: PPO | Admitting: Internal Medicine

## 2020-12-24 ENCOUNTER — Other Ambulatory Visit: Payer: Self-pay

## 2020-12-24 VITALS — BP 132/64 | HR 77 | Ht 64.0 in | Wt 164.0 lb

## 2020-12-24 DIAGNOSIS — N2581 Secondary hyperparathyroidism of renal origin: Secondary | ICD-10-CM | POA: Diagnosis not present

## 2020-12-24 DIAGNOSIS — I10 Essential (primary) hypertension: Secondary | ICD-10-CM

## 2020-12-24 DIAGNOSIS — N184 Chronic kidney disease, stage 4 (severe): Secondary | ICD-10-CM

## 2020-12-24 DIAGNOSIS — E785 Hyperlipidemia, unspecified: Secondary | ICD-10-CM

## 2020-12-24 DIAGNOSIS — F324 Major depressive disorder, single episode, in partial remission: Secondary | ICD-10-CM | POA: Diagnosis not present

## 2020-12-24 DIAGNOSIS — F411 Generalized anxiety disorder: Secondary | ICD-10-CM

## 2020-12-24 NOTE — Telephone Encounter (Signed)
Requested medication (s) are due for refill today:yes  Requested medication (s) are on the active medication list: yes  Last refill: 11/24/2020  Future visit scheduled:yes   Notes to clinic: this refill cannot be delegated   Requested Prescriptions  Pending Prescriptions Disp Refills   ALPRAZolam (XANAX) 0.25 MG tablet [Pharmacy Med Name: alprazolam 0.25 mg tablet] 60 tablet 1    Sig: TAKE ONE TABLET BY MOUTH TWICE DAILY AS NEEDED FOR ANXIETY      Not Delegated - Psychiatry:  Anxiolytics/Hypnotics Failed - 12/24/2020  9:31 AM      Failed - This refill cannot be delegated      Failed - Urine Drug Screen completed in last 360 days      Failed - Valid encounter within last 6 months    Recent Outpatient Visits           7 months ago Generalized anxiety disorder   [facility] Glean Hess, MD   11 months ago Essential hypertension   [facility] Glean Hess, MD   1 year ago Annual physical exam   [facility] Glean Hess, MD   2 years ago Chronic renal disease, stage III [facility])   [facility] Glean Hess, MD   2 years ago Annual physical exam   [facility] Glean Hess, MD       Future Appointments             Today Glean Hess, MD [facility]               mirtazapine (REMERON) 15 MG tablet [Pharmacy Med Name: mirtazapine 15 mg tablet] 30 tablet 1    Sig: TAKE ONE TABLET BY MOUTH AT BEDTIME      Psychiatry: Antidepressants - mirtazapine Failed - 12/24/2020  9:31 AM      Failed - Triglycerides in normal range and within 360 days    Triglycerides  Date Value Ref Range Status  07/04/2019 74 0 - 149 mg/dL Final          Failed - Total Cholesterol in normal range and within 360 days    Cholesterol, Total  Date Value Ref Range Status  07/04/2019 160 100 - 199 mg/dL Final          Failed - WBC in normal range and within 360 days    WBC  Date Value Ref Range Status   07/04/2019 8.4 3.4 - 10.8 x10E3/uL Final          Failed - Valid encounter within last 6 months    Recent Outpatient Visits           7 months ago Generalized anxiety disorder   [facility] Glean Hess, MD   11 months ago Essential hypertension   [place], Laura H, MD   1 year ago Annual physical exam   [facility] Glean Hess, MD   2 years ago Chronic renal disease, stage III [facility])   [facility] Glean Hess, MD   2 years ago Annual physical exam   [facility] Glean Hess, MD       Future Appointments             Today Glean Hess, MD [facility]             Passed - AST in normal range and within 360 days  AST  Date Value Ref Range Status  05/07/2020 14 0 - 40 IU/L Final          Passed - ALT in normal range and within 360 days    ALT  Date Value Ref Range Status  05/07/2020 11 0 - 32 IU/L Final          Passed - Completed PHQ-2 or PHQ-9 in the last 360 days        losartan (COZAAR) 100 MG tablet [Pharmacy Med Name: losartan 100 mg tablet] 90 tablet 1    Sig: TAKE ONE TABLET BY MOUTH ONCE DAILY      Cardiovascular:  Angiotensin Receptor Blockers Failed - 12/24/2020  9:31 AM      Failed - Cr in normal range and within 180 days    Creatinine, Ser  Date Value Ref Range Status  05/07/2020 1.47 (H) 0.57 - 1.00 mg/dL Final          Failed - K in normal range and within 180 days    Potassium  Date Value Ref Range Status  05/07/2020 4.4 3.5 - 5.2 mmol/L Final  12/28/2011 3.4 (L) 3.5 - 5.1 mmol/L Final          Failed - Valid encounter within last 6 months    Recent Outpatient Visits           7 months ago Generalized anxiety disorder   [facility] Glean Hess, MD   11 months ago Essential hypertension   [facility] Glean Hess, MD   1 year ago Annual physical exam   [facility] Glean Hess,  MD   2 years ago Chronic renal disease, stage III [facility])   [facility] Glean Hess, MD   2 years ago Annual physical exam   [facility] Glean Hess, MD       Future Appointments             Today Glean Hess, MD [facility] [place] - Patient is not pregnant      Passed - Last BP in normal range    BP Readings from Last 1 Encounters:  05/07/20 128/64            sertraline (ZOLOFT) 100 MG tablet [Pharmacy Med Name: sertraline 100 mg tablet] 90 tablet 1    Sig: TAKE ONE TABLET BY MOUTH ONCE DAILY      Psychiatry:  Antidepressants - SSRI Failed - 12/24/2020  9:31 AM      Failed - Valid encounter within last 6 months    Recent Outpatient Visits           7 months ago Generalized anxiety disorder   [facility] Glean Hess, MD   11 months ago Essential hypertension   Bernard, Laura H, MD   1 year ago Annual physical exam   [facility] Glean Hess, MD   2 years ago Chronic renal disease, stage III [facility])   [facility] Glean Hess, MD   2 years ago Annual physical exam   [facility] Glean Hess, MD       Future Appointments             Today Glean Hess, MD [facility] [place]  Passed - Completed PHQ-2 or PHQ-9 in the last 360 days        triamterene-hydrochlorothiazide (MAXZIDE-25) 37.5-25 MG tablet [Pharmacy Med Name: triamterene 37.5 mg-hydrochlorothiazide 25 mg tablet] 90 tablet 1    Sig: TAKE ONE TABLET BY MOUTH ONCE DAILY      Cardiovascular: Diuretic Combos Failed - 12/24/2020  9:31 AM      Failed - Cr in normal range and within 360 days    Creatinine, Ser  Date Value Ref Range Status  05/07/2020 1.47 (H) 0.57 - 1.00 mg/dL Final          Failed - Valid encounter within last 6 months    Recent Outpatient Visits           7 months ago Generalized anxiety disorder   [facility] Glean Hess, MD   11 months ago Essential hypertension   [facility] Glean Hess, MD   1 year ago Annual physical exam   [facility] Glean Hess, MD   2 years ago Chronic renal disease, stage III [facility])   [facility] Glean Hess, MD   2 years ago Annual physical exam   [facility] Glean Hess, MD       Future Appointments             Today Glean Hess, MD [facility] [place] in normal range and within 360 days    Potassium  Date Value Ref Range Status  05/07/2020 4.4 3.5 - 5.2 mmol/L Final  12/28/2011 3.4 (L) 3.5 - 5.1 mmol/L Final          Passed - Na in normal range and within 360 days    Sodium  Date Value Ref Range Status  05/07/2020 142 134 - 144 mmol/L Final          Passed - Ca in normal range and within 360 days    Calcium  Date Value Ref Range Status  05/07/2020 9.5 8.7 - 10.3 mg/dL Final          Passed - Last BP in normal range    BP Readings from Last 1 Encounters:  05/07/20 128/64

## 2020-12-24 NOTE — Patient Instructions (Addendum)
Increase Sertraline to 150 mg per day in the morning,  If helpful send a message for a refill on the updated strength.  Recommend scheduling a follow up with Nephrology - can wait to hear from labs from today before scheduling.

## 2020-12-24 NOTE — Progress Notes (Signed)
Date:  12/24/2020   Name:  Veronica Rowe   DOB:  Mar 24, 1941   MRN:  [id number]   Chief Complaint: Anxiety and Hypertension  Anxiety Presents for follow-up visit. Symptoms include excessive worry, nervous/anxious behavior and restlessness. Patient reports no chest pain, dizziness, irritability, palpitations or shortness of breath. Symptoms occur most days. The severity of symptoms is mild. The quality of sleep is fair.   Compliance with medications is 76-100% (zoloft, mirtazepine and xanax).  Hypertension This is a chronic problem. The problem is unchanged. The problem is controlled. Associated symptoms include anxiety. Pertinent negatives include no chest pain, headaches, palpitations or shortness of breath. Past treatments include diuretics and angiotensin blockers. The current treatment provides significant improvement.  Depression        This is a chronic problem.The problem is unchanged (some weeks are more stressful due to full time care of disabled husband with Parkinsons).  Associated symptoms include restlessness.  Associated symptoms include no fatigue, no appetite change and no headaches.  Past treatments include SSRIs - Selective serotonin reuptake inhibitors and other medications.  Compliance with treatment is good.  Previous treatment provided moderate relief.  Past medical history includes anxiety.   CKD stage 4 - She has not been seen by Nephrology since 2019. She denies chest pain, SOB, edema, skin change or rash.  Lab Results  Component Value Date   CREATININE 1.47 (H) 05/07/2020   BUN 32 (H) 05/07/2020   NA 142 05/07/2020   K 4.4 05/07/2020   CL 100 05/07/2020   CO2 28 05/07/2020   Lab Results  Component Value Date   CHOL 160 07/04/2019   HDL 57 07/04/2019   LDLCALC 89 07/04/2019   TRIG 74 07/04/2019   CHOLHDL 2.8 07/04/2019   Lab Results  Component Value Date   TSH 2.620 07/04/2019   No results found for: HGBA1C Lab Results  Component Value Date   WBC  8.4 07/04/2019   HGB 14.0 07/04/2019   HCT 41.4 07/04/2019   MCV 85 07/04/2019   PLT 248 07/04/2019   Lab Results  Component Value Date   ALT 11 05/07/2020   AST 14 05/07/2020   ALKPHOS 91 05/07/2020   BILITOT 0.3 05/07/2020     Review of Systems  Constitutional: Negative for appetite change, fatigue, irritability and unexpected weight change.  HENT: Positive for hearing loss. Negative for nosebleeds, tinnitus and trouble swallowing.   Eyes: Negative for visual disturbance.  Respiratory: Negative for cough, chest tightness, shortness of breath and wheezing.   Cardiovascular: Negative for chest pain, palpitations and leg swelling.  Gastrointestinal: Negative for abdominal pain, constipation and diarrhea.  Genitourinary: Negative for dysuria.  Musculoskeletal: Positive for arthralgias and back pain.  Neurological: Negative for dizziness, weakness, light-headedness (mild transient at times) and headaches.  Psychiatric/Behavioral: Positive for depression and dysphoric mood. Negative for sleep disturbance. The patient is nervous/anxious.     Patient Active Problem List   Diagnosis Date Noted  . Osteoarthritis of fingers of both hands 01/02/2020  . Lumbar spondylosis 03/27/2016  . Vitamin D deficiency 08/17/2015  . Essential hypertension 07/07/2015  . Major depressive disorder with single episode, in partial remission ([place]) 07/07/2015  . CKD (chronic kidney disease) stage 4, GFR 15-29 ml/min (HCC) 06/26/2015  . Hx of peptic ulcer 06/26/2015  . Generalized psoriasis 06/26/2015  . Stress incontinence, female 06/26/2015  . Dyslipidemia 06/26/2015  . Generalized anxiety disorder 06/26/2015    Allergies  Allergen Reactions  . Codeine Itching and  Other (See Comments)    hallucinations    Past Surgical History:  Procedure Laterality Date  . BREAST EXCISIONAL BIOPSY Right 2013   The patient's Baker Janus model risk for breast cancer is 1.4% for the next 5 years, 4.4% lifetime     Social History   Tobacco Use  . Smoking status: Never Smoker  . Smokeless tobacco: Never Used  Vaping Use  . Vaping Use: Never used  Substance Use Topics  . Alcohol use: No    Alcohol/week: 0.0 standard drinks  . Drug use: No     Medication list has been reviewed and updated.  Current Meds  Medication Sig  . acetaminophen (TYLENOL) 325 MG tablet Take 650 mg by mouth every 6 (six) hours as needed.  . Ascorbic Acid (VITAMIN C) 500 MG CAPS Take 500 mg by mouth daily.  . cholecalciferol (VITAMIN D) 1000 UNITS tablet Take 1,000 Units by mouth daily.  . fluticasone (FLONASE) 50 MCG/ACT nasal spray Place 2 sprays into both nostrils daily.  Marland Kitchen losartan (COZAAR) 100 MG tablet TAKE ONE TABLET BY MOUTH ONCE DAILY  . mirtazapine (REMERON) 15 MG tablet TAKE ONE TABLET BY MOUTH AT BEDTIME  . omeprazole (PRILOSEC) 20 MG capsule TAKE ONE CAPSULE BY MOUTH TWICE DAILY  . sertraline (ZOLOFT) 100 MG tablet TAKE ONE TABLET BY MOUTH ONCE DAILY  . simvastatin (ZOCOR) 20 MG tablet TAKE ONE TABLET BY MOUTH EVERY EVENING  . traMADol (ULTRAM) 50 MG tablet TAKE ONE TABLET BY MOUTH ONCE DAILY AS NEEDED FOR SEVERE PAIN  . triamterene-hydrochlorothiazide (MAXZIDE-25) 37.5-25 MG tablet TAKE ONE TABLET BY MOUTH ONCE DAILY  . zinc gluconate 50 MG tablet Take 50 mg by mouth daily.    PHQ 2/9 Scores 12/24/2020 05/07/2020 01/02/2020 12/31/2019  PHQ - 2 Score 1 0 0 0  PHQ- 9 Score 4 0 0 -    GAD 7 : Generalized Anxiety Score 12/24/2020 05/07/2020 01/02/2020  Nervous, Anxious, on Edge 1 0 0  Control/stop worrying 3 0 0  Worry too much - different things 3 0 0  Trouble relaxing 1 0 0  Restless 0 0 0  Easily annoyed or irritable 0 0 0  Afraid - awful might happen 1 0 0  Total GAD 7 Score 9 0 0  Anxiety Difficulty Not difficult at all Not difficult at all Not difficult at all    BP Readings from Last 3 Encounters:  12/24/20 132/64  05/07/20 128/64  01/02/20 104/62    Physical Exam Vitals and nursing note  reviewed.  Constitutional:      General: She is not in acute distress.    Appearance: Normal appearance. She is well-developed.  HENT:     Head: Normocephalic and atraumatic.  Neck:     Vascular: No carotid bruit.  Cardiovascular:     Rate and Rhythm: Normal rate and regular rhythm.     Pulses: Normal pulses.     Heart sounds: No murmur heard.   Pulmonary:     Effort: Pulmonary effort is normal. No respiratory distress.     Breath sounds: No wheezing or rhonchi.  Musculoskeletal:        General: Normal range of motion.     Cervical back: Normal range of motion.     Right lower leg: No edema.     Left lower leg: No edema.  Lymphadenopathy:     Cervical: No cervical adenopathy.  Skin:    General: Skin is warm and dry.     Capillary Refill:  Capillary refill takes less than 2 seconds.     Findings: No rash.  Neurological:     General: No focal deficit present.     Mental Status: She is alert and oriented to person, place, and time.  Psychiatric:        Attention and Perception: Attention normal.        Mood and Affect: Mood normal.        Speech: Speech normal.        Cognition and Memory: Cognition normal.     Wt Readings from Last 3 Encounters:  12/24/20 164 lb (74.4 kg)  05/07/20 152 lb (68.9 kg)  01/02/20 144 lb (65.3 kg)    BP 132/64   Pulse 77   Ht '5\' 4"'$  (1.626 m)   Wt 164 lb (74.4 kg)   SpO2 97%   BMI 28.15 kg/m   Assessment and Plan: 1. Essential hypertension Clinically stable exam with well controlled BP. Tolerating medications without side effects at this time. Pt to continue current regimen and low sodium diet; benefits of regular exercise as able discussed. - CBC with Differential/Platelet  2. CKD (chronic kidney disease) stage 4, GFR 15-29 ml/min (HCC) Will obtain labs and advise Recommend Nephrology follow up - Comprehensive metabolic panel  3. Generalized anxiety disorder Stable with daily use of low dose Xanax No evidence of misuse or  abuse  Refill sent   4. Dyslipidemia On statin therapy with no recent lipid panel Continue current medication - Lipid panel  5. Major depressive disorder with single episode, in partial remission (HCC) Depressive sx slightly worse at times. On zoloft 100 mg - will increase to 150 mg per day - call for new Rx if helpful No SI/HI; supportive family - TSH  6. Secondary hyperparathyroidism of renal origin [facility]) Labs overdue - will advise regarding nephrology follow up. - VITAMIN D 25 Hydroxy (Vit-D Deficiency, Fractures) - Parathyroid hormone, intact (no Ca) - Phosphorus   Partially dictated using Editor, commissioning. Any errors are unintentional.  Halina Maidens, MD [facility]  12/24/2020

## 2020-12-25 LAB — CBC WITH DIFFERENTIAL/PLATELET
Basophils Absolute: 0.1 10*3/uL (ref 0.0–0.2)
Basos: 1 %
EOS (ABSOLUTE): 0.3 10*3/uL (ref 0.0–0.4)
Eos: 4 %
Hematocrit: 39.5 % (ref 34.0–46.6)
Hemoglobin: 13.2 g/dL (ref 11.1–15.9)
Immature Grans (Abs): 0 10*3/uL (ref 0.0–0.1)
Immature Granulocytes: 0 %
Lymphocytes Absolute: 1.6 10*3/uL (ref 0.7–3.1)
Lymphs: 19 %
MCH: 28.1 pg (ref 26.6–33.0)
MCHC: 33.4 g/dL (ref 31.5–35.7)
MCV: 84 fL (ref 79–97)
Monocytes Absolute: 0.7 10*3/uL (ref 0.1–0.9)
Monocytes: 8 %
Neutrophils Absolute: 5.6 10*3/uL (ref 1.4–7.0)
Neutrophils: 68 %
Platelets: 251 10*3/uL (ref 150–450)
RBC: 4.7 x10E6/uL (ref 3.77–5.28)
RDW: 13 % (ref 11.7–15.4)
WBC: 8.3 10*3/uL (ref 3.4–10.8)

## 2020-12-25 LAB — COMPREHENSIVE METABOLIC PANEL
ALT: 6 IU/L (ref 0–32)
AST: 15 IU/L (ref 0–40)
Albumin/Globulin Ratio: 1.8 (ref 1.2–2.2)
Albumin: 4.7 g/dL (ref 3.7–4.7)
Alkaline Phosphatase: 90 IU/L (ref 44–121)
BUN/Creatinine Ratio: 20 (ref 12–28)
BUN: 38 mg/dL — ABNORMAL HIGH (ref 8–27)
Bilirubin Total: 0.5 mg/dL (ref 0.0–1.2)
CO2: 25 mmol/L (ref 20–29)
Calcium: 9.6 mg/dL (ref 8.7–10.3)
Chloride: 101 mmol/L (ref 96–106)
Creatinine, Ser: 1.87 mg/dL — ABNORMAL HIGH (ref 0.57–1.00)
Globulin, Total: 2.6 g/dL (ref 1.5–4.5)
Glucose: 91 mg/dL (ref 65–99)
Potassium: 4.2 mmol/L (ref 3.5–5.2)
Sodium: 141 mmol/L (ref 134–144)
Total Protein: 7.3 g/dL (ref 6.0–8.5)
eGFR: 27 mL/min/{1.73_m2} — ABNORMAL LOW (ref >59)

## 2020-12-25 LAB — LIPID PANEL
Chol/HDL Ratio: 3 ratio (ref 0.0–4.4)
Cholesterol, Total: 148 mg/dL (ref 100–199)
HDL: 50 mg/dL (ref >39)
LDL Chol Calc (NIH): 76 mg/dL (ref 0–99)
Triglycerides: 124 mg/dL (ref 0–149)
VLDL Cholesterol Cal: 22 mg/dL (ref 5–40)

## 2020-12-25 LAB — VITAMIN D 25 HYDROXY (VIT D DEFICIENCY, FRACTURES): Vit D, 25-Hydroxy: 57.4 ng/mL (ref 30.0–100.0)

## 2020-12-25 LAB — PARATHYROID HORMONE, INTACT (NO CA): PTH: 55 pg/mL (ref 15–65)

## 2020-12-25 LAB — TSH: TSH: 2.24 u[IU]/mL (ref 0.450–4.500)

## 2020-12-25 LAB — PHOSPHORUS: Phosphorus: 3.1 mg/dL (ref 3.0–4.3)

## 2020-12-29 ENCOUNTER — Telehealth: Payer: Self-pay | Admitting: Internal Medicine

## 2020-12-29 NOTE — Telephone Encounter (Signed)
Copied from [place] [id number] [phone]. Topic: Medicare AWV >> Dec 29, 2020  1:10 PM Cher Nakai R wrote: Reason for CRM: No answer unable to leave a message for patient to call back and schedule Medicare Annual Wellness Visit (AWV) in office.   If unable to come into the office for AWV,  please offer to do virtually or by telephone.  Last AWV:  12/31/2019  Please schedule at anytime with [facility] Health Advisor.  40 minute appointment  Any questions, please contact me at [phone]

## 2021-01-12 ENCOUNTER — Other Ambulatory Visit: Payer: Self-pay | Admitting: Internal Medicine

## 2021-01-12 DIAGNOSIS — M47816 Spondylosis without myelopathy or radiculopathy, lumbar region: Secondary | ICD-10-CM

## 2021-01-12 NOTE — Telephone Encounter (Signed)
Requested medication (s) are due for refill today: yes  Requested medication (s) are on the active medication list: yes   Last refill:  12/15/2020  Future visit scheduled: yes   Notes to clinic:  this refill cannot be delegated    Requested Prescriptions  Pending Prescriptions Disp Refills   traMADol (ULTRAM) 50 MG tablet [Pharmacy Med Name: tramadol 50 mg tablet] 30 tablet 0    Sig: TAKE ONE TABLET BY MOUTH ONCE DAILY AS NEEDED FOR SEVERE PAIN      Not Delegated - Analgesics:  Opioid Agonists Failed - 01/12/2021 11:02 AM      Failed - This refill cannot be delegated      Failed - Urine Drug Screen completed in last 360 days      Passed - Valid encounter within last 6 months    Recent Outpatient Visits           2 weeks ago CKD (chronic kidney disease) stage 4, GFR 15-29 ml/min [facility])   [facility] Glean [facility], MD   8 months ago Generalized anxiety disorder   [facility], [facility]   1 year ago Essential hypertension   [facility], [facility]   1 year ago Annual physical exam   [facility] Glean Hess, MD   2 years ago Chronic renal disease, stage III [facility])   [facility] Glean Hess, MD       Future Appointments             In 5 months Army Melia Jesse Sans, MD [facility], [facility]               simvastatin (ZOCOR) 20 MG tablet [Pharmacy Med Name: simvastatin 20 mg tablet] 30 tablet 1    Sig: TAKE ONE TABLET BY MOUTH EVERY EVENING      Cardiovascular:  Antilipid - Statins Passed - 01/12/2021 11:02 AM      Passed - Total Cholesterol in normal range and within 360 days    Cholesterol, Total  Date Value Ref Range Status  12/24/2020 148 100 - 199 mg/dL Final          Passed - LDL in normal range and within 360 days    LDL Chol Calc (NIH)  Date Value Ref Range Status  12/24/2020 76 0 - 99 mg/dL Final          Passed - HDL in normal range and within 360 days    HDL   Date Value Ref Range Status  12/24/2020 50 >39 mg/dL Final          Passed - Triglycerides in normal range and within 360 days    Triglycerides  Date Value Ref Range Status  12/24/2020 124 0 - 149 mg/dL Final          Passed - Patient is not pregnant      Passed - Valid encounter within last 12 months    Recent Outpatient Visits           2 weeks ago CKD (chronic kidney disease) stage 4, GFR 15-29 ml/min [facility])   [facility] Gle[facility], MD   8 months ago Generalized anxiety disorder   [facility] Glean Hess, MD   1 year ago Essential hypertension   [place], Laura H, MD   1 year ago Annual physical exam   [facility] Glean Hess, MD  2 years ago Chronic renal disease, stage III [facility])   [facility] Glean Hess, MD       Future Appointments             In 5 months Army Melia Jesse Sans, MD [facility], [facility]

## 2021-01-18 ENCOUNTER — Telehealth: Payer: PPO

## 2021-01-18 ENCOUNTER — Telehealth: Payer: Self-pay | Admitting: Pharmacist

## 2021-01-18 NOTE — Telephone Encounter (Signed)
  Chronic Care Management   Outreach Note  01/18/2021 Name: Veronica Rowe MRN: [id number] DOB: May 23, 1941  Referred by: Glean Hess, MD Reason for referral : No chief complaint on file.   An unsuccessful telephone outreach was attempted today. The patient was referred to the pharmacist for assistance with care management and care coordination.   Follow Up Plan: Will ask CPA to outreach patient and reschedule.  Junita Push. Kenton Kingfisher PharmD, [facility] [phone]

## 2021-01-18 NOTE — Progress Notes (Deleted)
Chronic Care Management Pharmacy Note  01/18/2021 Name:  Veronica Rowe MRN:  [id number] DOB:  01-17-1941  Subjective: Veronica Rowe is an 80 y.o. year old female who is a primary patient of Army Melia, Jesse Sans, MD.  The CCM team was consulted for assistance with disease management and care coordination needs.    Engaged with patient by telephone for follow up visit in response to provider referral for pharmacy case management and/or care coordination services.   Consent to Services:  The patient was given information about Chronic Care Management services, agreed to services, and gave verbal consent prior to initiation of services.  Please see initial visit note for detailed documentation.   Patient Care Team: Glean Hess, MD as PCP - General (Internal Medicine) Bary Castilla Forest Gleason, MD (General Surgery) Vladimir Faster, [facility] (Pharmacist)  Recent office visits: 12/24/20- berglund (PCP)- Lab work, GFR down to 27 ml/min, recommneds nephro appt  Recent consult visits: NA  Hospital visits: None in previous 6 months  Objective:  Lab Results  Component Value Date   CREATININE 1.87 (H) 12/24/2020   BUN 38 (H) 12/24/2020   GFRNONAA 34 (L) 05/07/2020   GFRAA 39 (L) 05/07/2020   NA 141 12/24/2020   K 4.2 12/24/2020   CALCIUM 9.6 12/24/2020   CO2 25 12/24/2020    No results found for: HGBA1C, FRUCTOSAMINE, GFR, MICROALBUR  Last diabetic Eye exam: No results found for: HMDIABEYEEXA  Last diabetic Foot exam: No results found for: HMDIABFOOTEX   Lab Results  Component Value Date   CHOL 148 12/24/2020   HDL 50 12/24/2020   LDLCALC 76 12/24/2020   TRIG 124 12/24/2020   CHOLHDL 3.0 12/24/2020    Hepatic Function Latest Ref Rng & Units 12/24/2020 05/07/2020 07/04/2019  Total Protein 6.0 - 8.5 g/dL 7.3 6.8 6.9  Albumin 3.7 - 4.7 g/dL 4.7 4.2 4.2  AST 0 - 40 IU/L _0 ALT 0 - 32 IU/L _1 Alk Phosphatase 44 - 121 IU/L 90 91 85  Total Bilirubin 0.0 - 1.2 mg/dL 0.5  0.3 0.2    Lab Results  Component Value Date/Time   TSH 2.240 12/24/2020 11:35 AM   TSH 2.620 07/04/2019 09:21 AM    CBC Latest Ref Rng & Units 12/24/2020 07/04/2019 01/29/2018  WBC 3.4 - 10.8 x10E3/uL 8.3 8.4 8.1  Hemoglobin 11.1 - 15.9 g/dL 13.2 14.0 13.5  Hematocrit 34.0 - 46.6 % 39.5 41.4 40.3  Platelets 150 - 450 x10E3/uL 251 248 268    Lab Results  Component Value Date/Time   VD25OH 57.4 12/24/2020 11:35 AM   VD25OH 56.8 05/07/2020 10:14 AM    Clinical ASCVD: No  The 10-year ASCVD risk score Mikey Bussing DC Jr., et al., 2013) is: 26.8%   Values used to calculate the score:     Age: 35 years     Sex: Female     Is Non-Hispanic African American: No     Diabetic: No     Tobacco smoker: No     Systolic Blood Pressure: 379 mmHg     Is BP treated: Yes     HDL Cholesterol: 50 mg/dL     Total Cholesterol: 148 mg/dL    Depression screen [facility] 2/9 12/24/2020 05/07/2020 01/02/2020  Decreased Interest 0 0 0  Down, Depressed, Hopeless 1 0 0  PHQ - 2 Score 1 0 0  Altered sleeping 1 0 0  Tired, decreased energy 2 0 0  Change in appetite  0 0 0  Feeling bad or failure about yourself  0 0 0  Trouble concentrating 0 0 0  Moving slowly or fidgety/restless 0 0 0  Suicidal thoughts 0 0 0  PHQ-9 Score 4 0 0  Difficult doing work/chores Not difficult at all Not difficult at all Not difficult at all  Some recent data might be hidden      Social History   Tobacco Use  Smoking Status Never Smoker  Smokeless Tobacco Never Used   BP Readings from Last 3 Encounters:  12/24/20 132/64  05/07/20 128/64  01/02/20 104/62   Pulse Readings from Last 3 Encounters:  12/24/20 77  05/07/20 83  01/02/20 89   Wt Readings from Last 3 Encounters:  12/24/20 164 lb (74.4 kg)  05/07/20 152 lb (68.9 kg)  01/02/20 144 lb (65.3 kg)    Assessment/Interventions: Review of patient past medical history, allergies, medications, health status, including review of consultants reports, laboratory and other  test data, was performed as part of comprehensive evaluation and provision of chronic care management services.   SDOH:  (Social Determinants of Health) assessments and interventions performed: No    Immunization History  Administered Date(s) Administered  . Fluad Quad(high Dose 65+) 07/04/2019  . Pneumococcal Conjugate-13 01/04/2016  . Pneumococcal Polysaccharide-23 10/05/2011  . Tdap 10/28/2013    Conditions to be addressed/monitored:  Hypertension, Hyperlipidemia, Chronic Kidney Disease, Depression, Anxiety, Osteoarthritis and lumbar spondylosis and h/o peptic ulcer  There are no care plans that you recently modified to display for this patient.    Medication Assistance: None required.  Patient affirms current coverage meets needs.  Patient's preferred pharmacy is:  [facility] [place] [address] [place] [address] Phone: [phone] Fax: [phone]  [facility] [place] - [address] [place]  [address] Phone: [phone] Fax: [phone]  Uses pill box? Yes-- daughter manages meds Pt endorses 100% compliance  We discussed: Benefits of medication synchronization, packaging and delivery as well as enhanced pharmacist oversight with [employer]. Patient decided to: Continue current medication management strategy  Care Plan and Follow Up Patient Decision:  Patient agrees to Care Plan and Follow-up.  Plan: Telephone follow up appointment with care management team member scheduled for:  3 months  Julie S. Harris PharmD, BCPS Clinical Pharmacist [facility] [phone]  

## 2021-01-26 ENCOUNTER — Other Ambulatory Visit: Payer: Self-pay | Admitting: Internal Medicine

## 2021-01-26 DIAGNOSIS — F411 Generalized anxiety disorder: Secondary | ICD-10-CM

## 2021-01-26 NOTE — Telephone Encounter (Signed)
Requested medication (s) are due for refill today: no  Requested medication (s) are on the active medication list: yes   Last refill: 01/26/2021  Future visit scheduled: yes  Notes to clinic:  review for future refills   Requested Prescriptions  Pending Prescriptions Disp Refills   ALPRAZolam (XANAX) 0.25 MG tablet [Pharmacy Med Name: alprazolam 0.25 mg tablet] 60 tablet 1    Sig: TAKE ONE TABLET BY MOUTH TWICE DAILY AS NEEDED FOR ANXIETY      Not Delegated - Psychiatry:  Anxiolytics/Hypnotics Failed - 01/26/2021  9:37 AM      Failed - This refill cannot be delegated      Failed - Urine Drug Screen completed in last 360 days      Passed - Valid encounter within last 6 months    Recent Outpatient Visits           1 month ago CKD (chronic kidney disease) stage 4, GFR 15-29 ml/min [facility])   [facility] Glean [facility], MD   8 months ago Generalized anxiety disorder   [facility] Gle[facility], [facility]   1 year ago Essential hypertension   [facility] Glean Hess, MD   1 year ago Annual physical exam   [facility] Glean Hess, MD   2 years ago Chronic renal disease, stage III [facility])   [facility] Glean Hess, MD       Future Appointments             In 5 months Army Melia Jesse Sans, MD [facility], [facility]

## 2021-02-01 ENCOUNTER — Encounter: Payer: Self-pay | Admitting: Internal Medicine

## 2021-02-02 ENCOUNTER — Other Ambulatory Visit: Payer: Self-pay

## 2021-02-02 DIAGNOSIS — F324 Major depressive disorder, single episode, in partial remission: Secondary | ICD-10-CM

## 2021-02-02 MED ORDER — SERTRALINE HCL 100 MG PO TABS: 1.5000 | ORAL_TABLET | Freq: Every day | ORAL | 1 refills | Status: DC

## 2021-02-10 ENCOUNTER — Other Ambulatory Visit: Payer: Self-pay | Admitting: Internal Medicine

## 2021-02-10 DIAGNOSIS — M47816 Spondylosis without myelopathy or radiculopathy, lumbar region: Secondary | ICD-10-CM

## 2021-02-10 NOTE — Telephone Encounter (Signed)
   Notes to clinic:This prescription was filled on 02/10/2021. Any refills authorized will be placed on file    Requested Prescriptions  Pending Prescriptions Disp Refills   traMADol (ULTRAM) 50 MG tablet [Pharmacy Med Name: tramadol 50 mg tablet] 30 tablet 1    Sig: TAKE ONE TABLET BY MOUTH ONCE DAILY AS NEEDED FOR SEVERE PAIN      Not Delegated - Analgesics:  Opioid Agonists Failed - 02/10/2021 10:20 AM      Failed - This refill cannot be delegated      Failed - Urine Drug Screen completed in last 360 days      Passed - Valid encounter within last 6 months    Recent Outpatient Visits           1 month ago CKD (chronic kidney disease) stage 4, GFR 15-29 ml/min [facility])   [facility] Glean [facility], MD   9 months ago Generalized anxiety disorder   [facility] Glean Hess, MD   1 year ago Essential hypertension   [facility] Glean Hess, MD   1 year ago Annual physical exam   [facility] Glean Hess, MD   2 years ago Chronic renal disease, stage III [facility])   [facility] Glean [facility], [facility]       Future Appointments             In 4 months Army Melia Jesse Sans, MD [facility], [facility]

## 2021-02-21 ENCOUNTER — Telehealth: Payer: Self-pay | Admitting: Internal Medicine

## 2021-02-21 NOTE — Telephone Encounter (Signed)
Copied from [place] [id number]. Topic: Medicare AWV >> Feb 21, 2021  1:19 PM Cher Nakai R wrote: Reason for CRM:   Left message for patient to call back and schedule Medicare Annual Wellness Visit (AWV) in office.   If unable to come into the office for AWV,  please offer to do virtually or by telephone.  Last AWV: 12/31/2019  Please schedule at anytime with [facility] Health Advisor.  40 minute appointment  Any questions, please contact me at [phone]

## 2021-02-22 ENCOUNTER — Other Ambulatory Visit: Payer: Self-pay | Admitting: Internal Medicine

## 2021-02-22 DIAGNOSIS — F411 Generalized anxiety disorder: Secondary | ICD-10-CM

## 2021-02-22 NOTE — Telephone Encounter (Signed)
Requested medication (s) are due for refill today: yes  Requested medication (s) are on the active medication list: yes   Last refill:  01/26/2021  Future visit scheduled: yes   Notes to clinic:  This refill cannot be delegated   Requested Prescriptions  Pending Prescriptions Disp Refills   ALPRAZolam (XANAX) 0.25 MG tablet [Pharmacy Med Name: alprazolam 0.25 mg tablet] 60 tablet 1    Sig: TAKE ONE TABLET BY MOUTH TWICE DAILY AS NEEDED FOR ANXIETY      Not Delegated - Psychiatry:  Anxiolytics/Hypnotics Failed - 02/22/2021  9:37 AM      Failed - This refill cannot be delegated      Failed - Urine Drug Screen completed in last 360 days      Passed - Valid encounter within last 6 months    Recent Outpatient Visits           2 months ago CKD (chronic kidney disease) stage 4, GFR 15-29 ml/min [facility])   [facility] Glean [facility], [facility]   9 months ago Generalized anxiety disorder   [facility] Gle[facility], MD   1 year ago Essential hypertension   [facility] Gle[facility], [facility]   1 year ago Annual physical exam   [facility] Glean Hess, MD   2 years ago Chronic renal disease, stage III [facility])   [facility] Glean Hess, MD       Future Appointments             In 4 months Army Melia Jesse Sans, MD [facility], [facility]

## 2021-02-22 NOTE — Telephone Encounter (Signed)
Please review. Last office visit 12/24/2020.  KP

## 2021-03-01 ENCOUNTER — Other Ambulatory Visit: Payer: Self-pay | Admitting: Internal Medicine

## 2021-03-01 DIAGNOSIS — F411 Generalized anxiety disorder: Secondary | ICD-10-CM

## 2021-03-01 DIAGNOSIS — M47816 Spondylosis without myelopathy or radiculopathy, lumbar region: Secondary | ICD-10-CM

## 2021-03-01 NOTE — Telephone Encounter (Signed)
Please review. Last office visit 12/24/2020.  KP

## 2021-03-01 NOTE — Telephone Encounter (Signed)
Requested medication (s) are due for refill today:  no  Requested medication (s) are on the active medication list:  yes   Last refill:  02/10/2021  Future visit scheduled: yes  Notes to clinic:  this refill cannot be delegated    Requested Prescriptions  Pending Prescriptions Disp Refills   traMADol (ULTRAM) 50 MG tablet [Pharmacy Med Name: tramadol 50 mg tablet] 30 tablet 1    Sig: TAKE ONE TABLET BY MOUTH ONCE DAILY AS NEEDED FOR SEVERE PAIN      Not Delegated - Analgesics:  Opioid Agonists Failed - 03/01/2021 11:36 AM      Failed - This refill cannot be delegated      Failed - Urine Drug Screen completed in last 360 days      Passed - Valid encounter within last 6 months    Recent Outpatient Visits           2 months ago CKD (chronic kidney disease) stage 4, GFR 15-29 ml/min [facility])   [facility]   9 months ago Generalized anxiety disorder   [facility] Glean Hess, MD   1 year ago Essential hypertension   [facility] Glean [facility], [facility]   1 year ago Annual physical exam   [facility] Glean Hess, MD   2 years ago Chronic renal disease, stage III [facility])   [facility] Glean [facility], MD       Future Appointments             In 4 months Army Melia Jesse Sans, MD [facility], [facility]

## 2021-03-10 ENCOUNTER — Other Ambulatory Visit: Payer: Self-pay | Admitting: Internal Medicine

## 2021-03-10 DIAGNOSIS — M47816 Spondylosis without myelopathy or radiculopathy, lumbar region: Secondary | ICD-10-CM

## 2021-03-10 NOTE — Telephone Encounter (Signed)
Please review. Last office visit 12/24/2020.  KP

## 2021-03-10 NOTE — Telephone Encounter (Signed)
Requested medication (s) are due for refill today: yes  Requested medication (s) are on the active medication list: yes  Last refill:  01/15/21 #30 1 refill  Future visit scheduled: yes  Notes to clinic:  not delegated per protocol     Requested Prescriptions  Pending Prescriptions Disp Refills   traMADol (ULTRAM) 50 MG tablet [Pharmacy Med Name: tramadol 50 mg tablet] 30 tablet 1    Sig: TAKE ONE TABLET BY MOUTH ONCE DAILY AS NEEDED FOR SEVERE PAIN      Not Delegated - Analgesics:  Opioid Agonists Failed - 03/10/2021  4:42 PM      Failed - This refill cannot be delegated      Failed - Urine Drug Screen completed in last 360 days      Passed - Valid encounter within last 6 months    Recent Outpatient Visits           2 months ago CKD (chronic kidney disease) stage 4, GFR 15-29 ml/min [facility])   [facility] Glean [facility], MD   10 months ago Generalized anxiety disorder   [facility] Glean [facility], MD   1 year ago Essential hypertension   [facility] Glean Hess, MD   1 year ago Annual physical exam   [facility] Glean Hess, MD   2 years ago Chronic renal disease, stage III [facility])   [facility] Glean [facility], MD       Future Appointments             In 3 months Army Melia Jesse Sans, MD [facility], [facility]

## 2021-03-18 ENCOUNTER — Telehealth: Payer: Self-pay

## 2021-03-18 NOTE — Chronic Care Management (AMB) (Signed)
    Chronic Care Management Pharmacy Assistant   Name: Veronica Rowe  MRN: [id number] DOB: [birth year]   Reason for Encounter: Disease State General     Recent office visits:  12/24/20-Veronica Ines Bloomer, MD (PCP) Seen for Anxiety and Hypertension. Labs ordered. On zoloft 100 mg - will increase to 150 mg per day. Recommend scheduling a follow up with Nephrology. Follow up in 6 months.  Recent consult visits:  None noted  Hospital visits:  None in previous 6 months  Medications: Outpatient Encounter Medications as of 03/18/2021  Medication Sig   acetaminophen (TYLENOL) 325 MG tablet Take 650 mg by mouth every 6 (six) hours as needed.   ALPRAZolam (XANAX) 0.25 MG tablet TAKE ONE TABLET BY MOUTH TWICE DAILY AS NEEDED FOR ANXIETY   Ascorbic Acid (VITAMIN C) 500 MG CAPS Take 500 mg by mouth daily.   cholecalciferol (VITAMIN D) 1000 UNITS tablet Take 1,000 Units by mouth daily.   fluticasone (FLONASE) 50 MCG/ACT nasal spray Place 2 sprays into both nostrils daily.   losartan (COZAAR) 100 MG tablet TAKE ONE TABLET BY MOUTH ONCE DAILY   mirtazapine (REMERON) 15 MG tablet TAKE ONE TABLET BY MOUTH AT BEDTIME   omeprazole (PRILOSEC) 20 MG capsule TAKE ONE CAPSULE BY MOUTH TWICE DAILY   sertraline (ZOLOFT) 100 MG tablet Take 1.5 tablets (150 mg total) by mouth daily.   simvastatin (ZOCOR) 20 MG tablet TAKE ONE TABLET BY MOUTH EVERY EVENING   traMADol (ULTRAM) 50 MG tablet TAKE ONE TABLET BY MOUTH ONCE DAILY AS NEEDED FOR SEVERE PAIN   triamterene-hydrochlorothiazide (MAXZIDE-25) 37.5-25 MG tablet TAKE ONE TABLET BY MOUTH ONCE DAILY   zinc gluconate 50 MG tablet Take 50 mg by mouth daily.   No facility-administered encounter medications on file as of 03/18/2021.   Have you had any problems recently with your health? Patient states she has no problems with her health at this time.  Have you had any problems with your pharmacy? Patient states she has no problems with her pharmacy.  What  issues or side effects are you having with your medications? Patient states she has no issues or side effects with her medications.  What would you like me to pass along to Veronica Rowe,CPP for them to help you with?  Patient states there is nothing at this time.  What can we do to take care of you better? Patient states there is nothing at this time.   Star Rating Drugs: Simvastatin  mg Last filled:02/10/21 30 DS Losartan 100 mg Last filled:02/22/21 30 DS  Veronica Rowe, Veronica Rowe

## 2021-03-22 ENCOUNTER — Other Ambulatory Visit: Payer: Self-pay | Admitting: Internal Medicine

## 2021-03-22 DIAGNOSIS — M47816 Spondylosis without myelopathy or radiculopathy, lumbar region: Secondary | ICD-10-CM

## 2021-03-22 NOTE — Telephone Encounter (Signed)
   Notes to clinic:  Review Tramadol for refill Looks like it was refilled on 01/15/2021 for 30 with one refill Omeprazole was last filled on 12/14/2020 for 30 patient should have been out in June      Requested Prescriptions  Pending Prescriptions Disp Refills   omeprazole (PRILOSEC) 20 MG capsule [Pharmacy Med Name: omeprazole 20 mg capsule,delayed release] 60 capsule 0    Sig: TAKE ONE [place]      Gastroenterology: Proton Pump Inhibitors Passed - 03/22/2021 12:18 PM      Passed - Valid encounter within last 12 months    Recent Outpatient Visits           2 months ago CKD (chronic kidney disease) stage 4, GFR 15-29 ml/min [facility])   [facility] Glean [facility], MD   10 months ago Generalized anxiety disorder   [facility] Glean Hess, MD   1 year ago Essential hypertension   [facility] Glean Hess, MD   1 year ago Annual physical exam   [facility], MD   2 years ago Chronic renal disease, stage III [facility])   [facility] Glean Hess, MD       Future Appointments             In 3 months Glean Hess, MD [facility], [facility]               traMADol (ULTRAM) 50 MG tablet [Pharmacy Med Name: tramadol 50 mg tablet] 30 tablet 1    Sig: TAKE ONE TABLET BY MOUTH ONCE DAILY AS NEEDED FOR SEVERE PAIN      Not Delegated - Analgesics:  Opioid Agonists Failed - 03/22/2021 12:18 PM      Failed - This refill cannot be delegated      Failed - Urine Drug Screen completed in last 360 days      Passed - Valid encounter within last 6 months    Recent Outpatient Visits           2 months ago CKD (chronic kidney disease) stage 4, GFR 15-29 ml/min [facility])   [facility] Glean Hess, MD   10 months ago Generalized anxiety disorder   [facility] Glean Hess, MD   1 year ago Essential hypertension   [facility] Glean Hess, MD   1  year ago Annual physical exam   [facility] Glean Hess, MD   2 years ago Chronic renal disease, stage III [facility])   [facility] Glean Hess, MD       Future Appointments             In 3 months Army Melia Jesse Sans, MD [facility], [facility]

## 2021-03-30 ENCOUNTER — Other Ambulatory Visit: Payer: Self-pay | Admitting: Internal Medicine

## 2021-03-30 DIAGNOSIS — F324 Major depressive disorder, single episode, in partial remission: Secondary | ICD-10-CM

## 2021-04-14 ENCOUNTER — Other Ambulatory Visit: Payer: Self-pay | Admitting: Internal Medicine

## 2021-04-14 DIAGNOSIS — Z1231 Encounter for screening mammogram for malignant neoplasm of breast: Secondary | ICD-10-CM

## 2021-04-19 ENCOUNTER — Telehealth: Payer: Self-pay | Admitting: Internal Medicine

## 2021-04-19 NOTE — Telephone Encounter (Signed)
Copied from [place] [phone]. Topic: Medicare AWV >> Apr 19, 2021  6:05 PM Cher Nakai R wrote: Reason for CRM:  Left message for patient to call back and schedule Medicare Annual Wellness Visit (AWV) in office.   If unable to come into the office for AWV,  please offer to do virtually or by telephone.  Last AWV:  12/31/2019  Please schedule at anytime with [facility] Health Advisor.  40 minute appointment  Any questions, please contact me at [phone]

## 2021-04-28 ENCOUNTER — Other Ambulatory Visit: Payer: Self-pay | Admitting: Internal Medicine

## 2021-04-28 DIAGNOSIS — F411 Generalized anxiety disorder: Secondary | ICD-10-CM

## 2021-04-28 DIAGNOSIS — F324 Major depressive disorder, single episode, in partial remission: Secondary | ICD-10-CM

## 2021-04-28 NOTE — Telephone Encounter (Signed)
Future visit in 2 months  

## 2021-04-28 NOTE — Telephone Encounter (Signed)
Requested medication (s) are due for refill today: see encounter   Requested medication (s) are on the active medication list: yes  Last refill:  xanax 7/13/ 22 #60 1 refill, zoloft 02/02/21 #135 1 refill  Future visit scheduled: yes in 2 months  Notes to clinic:  xanax - not delegated per protocol. Zoloft last refill 04/28/21     Requested Prescriptions  Pending Prescriptions Disp Refills   ALPRAZolam (XANAX) 0.25 MG tablet [Pharmacy Med Name: alprazolam 0.25 mg tablet] 60 tablet 1    Sig: TAKE ONE TABLET BY MOUTH TWICE DAILY AS NEEDED FOR ANXIETY     Not Delegated - Psychiatry:  Anxiolytics/Hypnotics Failed - 04/28/2021 12:16 PM      Failed - This refill cannot be delegated      Failed - Urine Drug Screen completed in last 360 days      Passed - Valid encounter within last 6 months    Recent Outpatient Visits           4 months ago CKD (chronic kidney disease) stage 4, GFR 15-29 ml/min [facility])   [facility] Glean [facility], [facility]   11 months ago Generalized anxiety disorder   [facility] Glean Hess, [facility]   1 year ago Essential hypertension   [facility] Glean [facility], [facility]   1 year ago Annual physical exam   [facility], [facility]   2 years ago Chronic renal disease, stage III [facility])   [facility] Glean Hess, MD       Future Appointments             In 2 months Army Melia Jesse Sans, MD [facility], [facility]             sertraline (ZOLOFT) 100 MG tablet [Pharmacy Med Name: sertraline 100 mg tablet] 135 tablet 1    Sig: TAKE 1 AND 1/2 TABLETS BY MOUTH ONCE A DAY     Psychiatry:  Antidepressants - SSRI Passed - 04/28/2021 12:16 PM      Passed - Completed PHQ-2 or PHQ-9 in the last 360 days      Passed - Valid encounter within last 6 months    Recent Outpatient Visits           4 months ago CKD (chronic kidney disease) stage 4, GFR 15-29 ml/min [facility])   [facility]   11 months ago Generalized anxiety disorder   [facility] Glean [facility], [facility]   1 year ago Essential hypertension   [facility]   1 year ago Annual physical exam   [facility] Glean Hess, MD   2 years ago Chronic renal disease, stage III [facility])   [facility] Glean [facility], [facility]       Future Appointments             In 2 months Army Melia Jesse Sans, MD [facility], [facility]            Signed Prescriptions Disp Refills   mirtazapine (REMERON) 15 MG tablet 30 tablet 1    Sig: TAKE ONE TABLET BY MOUTH AT BEDTIME     Psychiatry: Antidepressants - mirtazapine Passed - 04/28/2021 12:16 PM      Passed - AST in normal range and within 360 days    AST  Date Value Ref Range Status  12/24/2020 15 0 -  40 IU/L Final          Passed - ALT in normal range and within 360 days    ALT  Date Value Ref Range Status  12/24/2020 6 0 - 32 IU/L Final          Passed - Triglycerides in normal range and within 360 days    Triglycerides  Date Value Ref Range Status  12/24/2020 124 0 - 149 mg/dL Final          Passed - Total Cholesterol in normal range and within 360 days    Cholesterol, Total  Date Value Ref Range Status  12/24/2020 148 100 - 199 mg/dL Final          Passed - WBC in normal range and within 360 days    WBC  Date Value Ref Range Status  12/24/2020 8.3 3.4 - 10.8 x10E3/uL Final          Passed - Completed PHQ-2 or PHQ-9 in the last 360 days      Passed - Valid encounter within last 6 months    Recent Outpatient Visits           4 months ago CKD (chronic kidney disease) stage 4, GFR 15-29 ml/min [facility])   [facility]   11 months ago Generalized anxiety disorder   [facility]   1 year ago Essential hypertension   [facility] Gle[facility], [facility]   1 year ago Annual physical exam   [facility]  Glean Hess, MD   2 years ago Chronic renal disease, stage III [facility])   [facility] Glean [facility], MD       Future Appointments             In 2 months Army Melia Jesse Sans, MD [facility], [facility]

## 2021-04-28 NOTE — Telephone Encounter (Signed)
Please review. Last office visit 12/24/2020.  KP

## 2021-06-07 ENCOUNTER — Other Ambulatory Visit: Payer: Self-pay | Admitting: Internal Medicine

## 2021-06-07 DIAGNOSIS — F411 Generalized anxiety disorder: Secondary | ICD-10-CM

## 2021-06-07 DIAGNOSIS — M47816 Spondylosis without myelopathy or radiculopathy, lumbar region: Secondary | ICD-10-CM

## 2021-06-07 DIAGNOSIS — F324 Major depressive disorder, single episode, in partial remission: Secondary | ICD-10-CM

## 2021-06-07 NOTE — Telephone Encounter (Signed)
Requested Prescriptions  Pending Prescriptions Disp Refills  . ALPRAZolam (XANAX) 0.25 MG tablet [Pharmacy Med Name: alprazolam 0.25 mg tablet] 60 tablet     Sig: TAKE ONE TABLET BY MOUTH TWICE DAILY AS NEEDED FOR ANXIETY     Not Delegated - Psychiatry:  Anxiolytics/Hypnotics Failed - 06/07/2021 12:27 PM      Failed - This refill cannot be delegated      Failed - Urine Drug Screen completed in last 360 days      Passed - Valid encounter within last 6 months    Recent Outpatient Visits          5 months ago CKD (chronic kidney disease) stage 4, GFR 15-29 ml/min [facility])   [facility], [facility]   1 year ago Generalized anxiety disorder   [facility], [facility]   1 year ago Essential hypertension   [facility] Glean Hess, [facility]   1 year ago Annual physical exam   [facility] Glean Hess, MD   2 years ago Chronic renal disease, stage III [facility])   [facility] Glean Hess, MD      Future Appointments            In 3 weeks Glean Hess, MD [facility]           . traMADol (ULTRAM) 50 MG tablet [Pharmacy Med Name: tramadol 50 mg tablet] 30 tablet     Sig: TAKE ONE TABLET BY MOUTH ONCE DAILY AS NEEDED FOR SEVERE PAIN     Not Delegated - Analgesics:  Opioid Agonists Failed - 06/07/2021 12:27 PM      Failed - This refill cannot be delegated      Failed - Urine Drug Screen completed in last 360 days      Passed - Valid encounter within last 6 months    Recent Outpatient Visits          5 months ago CKD (chronic kidney disease) stage 4, GFR 15-29 ml/min [facility])   [facility] Gle[facility], [facility]   1 year ago Generalized anxiety disorder   [facility] Gle[facility], [facility]   1 year ago Essential hypertension   [facility] Glean Hess, MD   1 year ago Annual physical exam   [facility] Glean Hess, MD   2 years ago Chronic  renal disease, stage III [facility])   [facility] Glean Hess, MD      Future Appointments            In 3 weeks Glean Hess, MD [facility]           . mirtazapine (REMERON) 15 MG tablet [Pharmacy Med Name: mirtazapine 15 mg tablet] 30 tablet 0    Sig: TAKE ONE TABLET BY MOUTH AT BEDTIME     Psychiatry: Antidepressants - mirtazapine Passed - 06/07/2021 12:27 PM      Passed - AST in normal range and within 360 days    AST  Date Value Ref Range Status  12/24/2020 15 0 - 40 IU/L Final         Passed - ALT in normal range and within 360 days    ALT  Date Value Ref Range Status  12/24/2020 6 0 - 32 IU/L Final         Passed - Triglycerides in normal range and within  360 days    Triglycerides  Date Value Ref Range Status  12/24/2020 124 0 - 149 mg/dL Final         Passed - Total Cholesterol in normal range and within 360 days    Cholesterol, Total  Date Value Ref Range Status  12/24/2020 148 100 - 199 mg/dL Final         Passed - WBC in normal range and within 360 days    WBC  Date Value Ref Range Status  12/24/2020 8.3 3.4 - 10.8 x10E3/uL Final         Passed - Completed PHQ-2 or PHQ-9 in the last 360 days      Passed - Valid encounter within last 6 months    Recent Outpatient Visits          5 months ago CKD (chronic kidney disease) stage 4, GFR 15-29 ml/min [facility])   [facility] Glean [facility], MD   1 year ago Generalized anxiety disorder   [facility], [facility]   1 year ago Essential hypertension   [facility] Glean [facility], [facility]   1 year ago Annual physical exam   [facility] Glean Hess, MD   2 years ago Chronic renal disease, stage III [facility])   [facility] Glean [facility], [facility]      Future Appointments            In 3 weeks Army Melia Jesse Sans, MD [facility], [facility]

## 2021-06-07 NOTE — Telephone Encounter (Signed)
Requested medication (s) are due for refill today: yes  Requested medication (s) are on the active medication list: yes  Last refill:  alprazolam: 04/29/21     tramadol: 03/22/21  Future visit scheduled: yes  Notes to clinic:  meds not delegated to NT to RF   Requested Prescriptions  Pending Prescriptions Disp Refills   ALPRAZolam (XANAX) 0.25 MG tablet [Pharmacy Med Name: alprazolam 0.25 mg tablet] 60 tablet     Sig: TAKE ONE TABLET BY MOUTH TWICE DAILY AS NEEDED FOR ANXIETY     Not Delegated - Psychiatry:  Anxiolytics/Hypnotics Failed - 06/07/2021 12:27 PM      Failed - This refill cannot be delegated      Failed - Urine Drug Screen completed in last 360 days      Passed - Valid encounter within last 6 months    Recent Outpatient Visits           5 months ago CKD (chronic kidney disease) stage 4, GFR 15-29 ml/min [facility])   [facility] Gle[facility], [facility]   1 year ago Generalized anxiety disorder   [facility]   1 year ago Essential hypertension   [facility] Glean Hess, [facility]   1 year ago Annual physical exam   [facility] Gle[facility], MD   2 years ago Chronic renal disease, stage III [facility])   [facility] Glean Hess, MD       Future Appointments             In 3 weeks Glean Hess, MD [facility]             traMADol (ULTRAM) 50 MG tablet [Pharmacy Med Name: tramadol 50 mg tablet] 30 tablet     Sig: TAKE ONE TABLET BY MOUTH ONCE DAILY AS NEEDED FOR SEVERE PAIN     Not Delegated - Analgesics:  Opioid Agonists Failed - 06/07/2021 12:27 PM      Failed - This refill cannot be delegated      Failed - Urine Drug Screen completed in last 360 days      Passed - Valid encounter within last 6 months    Recent Outpatient Visits           5 months ago CKD (chronic kidney disease) stage 4, GFR 15-29 ml/min [facility])   [facility] Glean [facility], MD   1  year ago Generalized anxiety disorder   [facility] Gle[facility]   1 year ago Essential hypertension   [facility] Gle[facility], [facility]   1 year ago Annual physical exam   [facility] Glean Hess, MD   2 years ago Chronic renal disease, stage III [facility])   [facility] Glean Hess, MD       Future Appointments             In 3 weeks Glean Hess, MD [facility], [facility]            Signed Prescriptions Disp Refills   mirtazapine (REMERON) 15 MG tablet 30 tablet 0    Sig: TAKE ONE TABLET BY MOUTH AT BEDTIME     Psychiatry: Antidepressants - mirtazapine Passed - 06/07/2021 12:27 PM      Passed - AST in normal range and within 360 days    AST  Date Value Ref Range Status  12/24/2020 15  0 - 40 IU/L Final          Passed - ALT in normal range and within 360 days    ALT  Date Value Ref Range Status  12/24/2020 6 0 - 32 IU/L Final          Passed - Triglycerides in normal range and within 360 days    Triglycerides  Date Value Ref Range Status  12/24/2020 124 0 - 149 mg/dL Final          Passed - Total Cholesterol in normal range and within 360 days    Cholesterol, Total  Date Value Ref Range Status  12/24/2020 148 100 - 199 mg/dL Final          Passed - WBC in normal range and within 360 days    WBC  Date Value Ref Range Status  12/24/2020 8.3 3.4 - 10.8 x10E3/uL Final          Passed - Completed PHQ-2 or PHQ-9 in the last 360 days      Passed - Valid encounter within last 6 months    Recent Outpatient Visits           5 months ago CKD (chronic kidney disease) stage 4, GFR 15-29 ml/min [facility])   [facility] Glean [facility], [facility]   1 year ago Generalized anxiety disorder   [facility] Glean Hess, [facility]   1 year ago Essential hypertension   [facility] Glean Hess, MD   1 year ago Annual physical exam   [facility] Glean Hess, MD    2 years ago Chronic renal disease, stage III [facility])   [facility] Glean Hess, MD       Future Appointments             In 3 weeks Army Melia Jesse Sans, MD [facility], [facility]

## 2021-06-10 ENCOUNTER — Ambulatory Visit
Admission: RE | Admit: 2021-06-10 | Discharge: 2021-06-10 | Disposition: A | Discharge status: Home / Self Care | Payer: PPO | Source: Ambulatory Visit | Attending: Internal Medicine | Admitting: Internal Medicine

## 2021-06-10 ENCOUNTER — Other Ambulatory Visit: Payer: Self-pay

## 2021-06-10 DIAGNOSIS — Z1231 Encounter for screening mammogram for malignant neoplasm of breast: Secondary | ICD-10-CM | POA: Insufficient documentation

## 2021-07-01 ENCOUNTER — Other Ambulatory Visit: Payer: Self-pay

## 2021-07-01 ENCOUNTER — Encounter: Payer: Self-pay | Admitting: Internal Medicine

## 2021-07-01 ENCOUNTER — Ambulatory Visit (INDEPENDENT_AMBULATORY_CARE_PROVIDER_SITE_OTHER): Payer: PPO | Admitting: Internal Medicine

## 2021-07-01 VITALS — BP 118/62 | HR 73 | Ht 64.0 in | Wt 162.8 lb

## 2021-07-01 DIAGNOSIS — F411 Generalized anxiety disorder: Secondary | ICD-10-CM

## 2021-07-01 DIAGNOSIS — F324 Major depressive disorder, single episode, in partial remission: Secondary | ICD-10-CM

## 2021-07-01 DIAGNOSIS — N184 Chronic kidney disease, stage 4 (severe): Secondary | ICD-10-CM | POA: Diagnosis not present

## 2021-07-01 DIAGNOSIS — I10 Essential (primary) hypertension: Secondary | ICD-10-CM

## 2021-07-01 MED ORDER — TRIAMTERENE-HCTZ 37.5-25 MG PO TABS: 1.0000 | ORAL_TABLET | Freq: Every day | ORAL | 1 refills | Status: DC

## 2021-07-01 MED ORDER — LOSARTAN POTASSIUM 100 MG PO TABS: 100.0000 mg | ORAL_TABLET | Freq: Every day | ORAL | 1 refills | Status: DC

## 2021-07-01 MED ORDER — MIRTAZAPINE 15 MG PO TABS: 15.0000 mg | ORAL_TABLET | Freq: Every day | ORAL | 1 refills | Status: DC

## 2021-07-01 NOTE — Progress Notes (Signed)
Date:  07/01/2021   Name:  Veronica Rowe   DOB:  1940-10-27   MRN:  [id number]   Chief Complaint: Hypertension, Depression, and Anxiety  Hypertension This is a chronic problem. The problem is controlled. Associated symptoms include anxiety. Pertinent negatives include no chest pain, headaches, palpitations or shortness of breath. Past treatments include angiotensin blockers and diuretics. The current treatment provides significant improvement. There are no compliance problems.  Hypertensive end-organ damage includes kidney disease. There is no history of CAD/MI or CVA.  Depression        This is a chronic problem.The problem is unchanged.  Associated symptoms include no fatigue and no headaches.  Past treatments include SSRIs - Selective serotonin reuptake inhibitors (and Remeron).  Past medical history includes anxiety.   Anxiety Presents for follow-up (much improved and using xanax less) visit. Symptoms include nervous/anxious behavior. Patient reports no chest pain, dizziness, palpitations or shortness of breath.     Lab Results  Component Value Date   CREATININE 1.87 (H) 12/24/2020   BUN 38 (H) 12/24/2020   NA 141 12/24/2020   K 4.2 12/24/2020   CL 101 12/24/2020   CO2 25 12/24/2020   Lab Results  Component Value Date   CHOL 148 12/24/2020   HDL 50 12/24/2020   LDLCALC 76 12/24/2020   TRIG 124 12/24/2020   CHOLHDL 3.0 12/24/2020   Lab Results  Component Value Date   TSH 2.240 12/24/2020   No results found for: HGBA1C Lab Results  Component Value Date   WBC 8.3 12/24/2020   HGB 13.2 12/24/2020   HCT 39.5 12/24/2020   MCV 84 12/24/2020   PLT 251 12/24/2020   Lab Results  Component Value Date   ALT 6 12/24/2020   AST 15 12/24/2020   ALKPHOS 90 12/24/2020   BILITOT 0.5 12/24/2020   No components found for: VITD  Review of Systems  Constitutional:  Negative for fatigue and unexpected weight change.  HENT:  Negative for nosebleeds.   Eyes:  Negative for  visual disturbance.  Respiratory:  Negative for cough, chest tightness, shortness of breath and wheezing.   Cardiovascular:  Negative for chest pain, palpitations and leg swelling.  Gastrointestinal:  Negative for abdominal pain, constipation and diarrhea.  Musculoskeletal:  Negative for arthralgias and gait problem.  Neurological:  Negative for dizziness, weakness, light-headedness and headaches.  Psychiatric/Behavioral:  Positive for depression and dysphoric mood. Negative for sleep disturbance. The patient is nervous/anxious.    Patient Active Problem List   Diagnosis Date Noted   Osteoarthritis of fingers of both hands 01/02/2020   Lumbar spondylosis 03/27/2016   Vitamin D deficiency 08/17/2015   Essential hypertension 07/07/2015   Major depressive disorder with single episode, in partial remission ([place]) 07/07/2015   CKD (chronic kidney disease) stage 4, GFR 15-29 ml/min ([place]) 06/26/2015   Hx of peptic ulcer 06/26/2015   Generalized psoriasis 06/26/2015   Stress incontinence, female 06/26/2015   Dyslipidemia 06/26/2015   Generalized anxiety disorder 06/26/2015    Allergies  Allergen Reactions   Codeine Itching and Other (See Comments)    hallucinations    Past Surgical History:  Procedure Laterality Date   BREAST EXCISIONAL BIOPSY Right 2013   The patient's Baker Janus model risk for breast cancer is 1.4% for the next 5 years, 4.4% lifetime    Social History   Tobacco Use   Smoking status: Never   Smokeless tobacco: Never  Vaping Use   Vaping Use: Never used  Substance Use  Topics   Alcohol use: No    Alcohol/week: 0.0 standard drinks   Drug use: No     Medication list has been reviewed and updated.  Current Meds  Medication Sig   acetaminophen (TYLENOL) 325 MG tablet Take 650 mg by mouth every 6 (six) hours as needed.   ALPRAZolam (XANAX) 0.25 MG tablet TAKE ONE TABLET BY MOUTH TWICE DAILY AS NEEDED FOR ANXIETY   Ascorbic Acid (VITAMIN C) 500 MG CAPS Take 500 mg by  mouth daily.   cholecalciferol (VITAMIN D) 1000 UNITS tablet Take 1,000 Units by mouth daily.   fluticasone (FLONASE) 50 MCG/ACT nasal spray Place 2 sprays into both nostrils daily.   losartan (COZAAR) 100 MG tablet TAKE ONE TABLET BY MOUTH ONCE DAILY   mirtazapine (REMERON) 15 MG tablet TAKE ONE TABLET BY MOUTH AT BEDTIME   omeprazole (PRILOSEC) 20 MG capsule TAKE ONE CAPSULE BY MOUTH TWICE DAILY   sertraline (ZOLOFT) 100 MG tablet TAKE 1 AND 1/2 TABLETS BY MOUTH ONCE A DAY   simvastatin (ZOCOR) 20 MG tablet TAKE ONE TABLET BY MOUTH EVERY EVENING   traMADol (ULTRAM) 50 MG tablet TAKE ONE TABLET BY MOUTH ONCE DAILY AS NEEDED FOR SEVERE PAIN   triamterene-hydrochlorothiazide (MAXZIDE-25) 37.5-25 MG tablet TAKE ONE TABLET BY MOUTH ONCE DAILY   zinc gluconate 50 MG tablet Take 50 mg by mouth daily.    PHQ 2/9 Scores 07/01/2021 12/24/2020 05/07/2020 01/02/2020  PHQ - 2 Score 0 1 0 0  PHQ- 9 Score 1 4 0 0    GAD 7 : Generalized Anxiety Score 07/01/2021 12/24/2020 05/07/2020 01/02/2020  Nervous, Anxious, on Edge 0 1 0 0  Control/stop worrying 0 3 0 0  Worry too much - different things 0 3 0 0  Trouble relaxing 0 1 0 0  Restless 0 0 0 0  Easily annoyed or irritable 0 0 0 0  Afraid - awful might happen 0 1 0 0  Total GAD 7 Score 0 9 0 0  Anxiety Difficulty Not difficult at all Not difficult at all Not difficult at all Not difficult at all    BP Readings from Last 3 Encounters:  07/01/21 118/62  12/24/20 132/64  05/07/20 128/64    Physical Exam Vitals and nursing note reviewed.  Constitutional:      General: She is not in acute distress.    Appearance: Normal appearance. She is well-developed.  HENT:     Head: Normocephalic and atraumatic.  Cardiovascular:     Rate and Rhythm: Normal rate and regular rhythm.     Pulses: Normal pulses.     Heart sounds: No murmur heard. Pulmonary:     Effort: Pulmonary effort is normal. No respiratory distress.     Breath sounds: No wheezing or  rhonchi.  Musculoskeletal:     Cervical back: Normal range of motion.     Right lower leg: No edema.  Lymphadenopathy:     Cervical: No cervical adenopathy.  Skin:    General: Skin is warm and dry.     Capillary Refill: Capillary refill takes less than 2 seconds.     Findings: No rash.  Neurological:     General: No focal deficit present.     Mental Status: She is alert and oriented to person, place, and time.  Psychiatric:        Mood and Affect: Mood normal.        Behavior: Behavior normal.    Wt Readings from Last 3 Encounters:  07/01/21 162 lb 12.8 oz (73.8 kg)  12/24/20 164 lb (74.4 kg)  05/07/20 152 lb (68.9 kg)    BP 118/62   Pulse 73   Ht 5\' 4"  (1.626 m)   Wt 162 lb 12.8 oz (73.8 kg)   SpO2 97%   BMI 27.94 kg/m   Assessment and Plan: 1. Generalized anxiety disorder Much improved - likely the reduced stress since her husband passed away Continue PRN use - will not refill at this time.  2. Essential hypertension Clinically stable exam with well controlled BP. Tolerating medications without side effects at this time. Pt to continue current regimen and low sodium diet; benefits of regular exercise as able discussed. - losartan (COZAAR) 100 MG tablet; Take 1 tablet (100 mg total) by mouth daily.  Dispense: 90 tablet; Refill: 1 - triamterene-hydrochlorothiazide (MAXZIDE-25) 37.5-25 MG tablet; Take 1 tablet by mouth daily.  Dispense: 90 tablet; Refill: 1  3. Major depressive disorder with single episode, in partial remission (HCC) Clinically stable on current regimen with good control of symptoms, No SI or HI. Will continue current therapy. - mirtazapine (REMERON) 15 MG tablet; Take 1 tablet (15 mg total) by mouth at bedtime.  Dispense: 90 tablet; Refill: 1  4. CKD (chronic kidney disease) stage 4, GFR 15-29 ml/min (HCC) Worsening renal function with normal parameters. Patient agrees to see Dr. Holley Raring - Ambulatory referral to Nephrology   Partially dictated  using Dragon software. Any errors are unintentional.  Halina Maidens, MD [facility]  07/01/2021

## 2021-08-23 ENCOUNTER — Telehealth: Payer: Self-pay | Admitting: Internal Medicine

## 2021-08-23 NOTE — Telephone Encounter (Signed)
Copied from [place] [id number]. Topic: Medicare AWV >> Aug 23, 2021 11:24 AM Cher Nakai R wrote: Reason for CRM:  Left message for patient to call back and schedule Medicare Annual Wellness Visit (AWV) in office.   If unable to come into the office for AWV,  please offer to do virtually or by telephone.  Last AWV: 12/31/2019   Please schedule at anytime with [facility] Health Advisor.      40 Minutes appointment   Any questions, please call me at [phone]

## 2021-08-29 ENCOUNTER — Other Ambulatory Visit: Payer: Self-pay | Admitting: Nephrology

## 2021-08-29 DIAGNOSIS — N184 Chronic kidney disease, stage 4 (severe): Secondary | ICD-10-CM | POA: Diagnosis not present

## 2021-08-29 DIAGNOSIS — N183 Chronic kidney disease, stage 3 unspecified: Secondary | ICD-10-CM | POA: Diagnosis not present

## 2021-08-29 DIAGNOSIS — I10 Essential (primary) hypertension: Secondary | ICD-10-CM | POA: Diagnosis not present

## 2021-08-29 DIAGNOSIS — N2581 Secondary hyperparathyroidism of renal origin: Secondary | ICD-10-CM | POA: Diagnosis not present

## 2021-08-29 DIAGNOSIS — R809 Proteinuria, unspecified: Secondary | ICD-10-CM | POA: Diagnosis not present

## 2021-09-14 ENCOUNTER — Other Ambulatory Visit: Payer: Self-pay | Admitting: Internal Medicine

## 2021-09-14 DIAGNOSIS — F411 Generalized anxiety disorder: Secondary | ICD-10-CM

## 2021-09-14 NOTE — Telephone Encounter (Signed)
Refill if appropriate.  Please advise. Last filled 06/07/21.

## 2021-09-14 NOTE — Telephone Encounter (Signed)
Requested medication (s) are due for refill today: yes  Requested medication (s) are on the active medication list: yes  Last refill:  04/29/21 #60/1  Future visit scheduled: yes  Notes to clinic:  Unable to refill per protocol, cannot delegate.      Requested Prescriptions  Pending Prescriptions Disp Refills   ALPRAZolam (XANAX) 0.25 MG tablet [Pharmacy Med Name: alprazolam 0.25 mg tablet] 60 tablet 1    Sig: TAKE ONE TABLET BY MOUTH TWICE DAILY AS NEEDED FOR ANXIETY     Not Delegated - Psychiatry: Anxiolytics/Hypnotics 2 Failed - 09/14/2021  9:32 AM      Failed - This refill cannot be delegated      Failed - Urine Drug Screen completed in last 360 days      Passed - Patient is not pregnant      Passed - Valid encounter within last 6 months    Recent Outpatient Visits           2 months ago CKD (chronic kidney disease) stage 4, GFR 15-29 ml/min [facility])   [facility] Glean [facility], MD   8 months ago CKD (chronic kidney disease) stage 4, GFR 15-29 ml/min [facility])   [facility] Glean Hess, MD   1 year ago Generalized anxiety disorder   [facility] Glean [facility], MD   1 year ago Essential hypertension   [facility] Glean Hess, MD   2 years ago Annual physical exam   [facility] Glean Hess, MD       Future Appointments             In 3 months Army Melia Jesse Sans, MD [facility], [facility]

## 2021-11-02 ENCOUNTER — Other Ambulatory Visit: Payer: Self-pay

## 2021-11-02 ENCOUNTER — Other Ambulatory Visit: Payer: Self-pay | Admitting: Internal Medicine

## 2021-11-02 MED ORDER — SIMVASTATIN 20 MG PO TABS: 20.0000 mg | ORAL_TABLET | Freq: Every evening | ORAL | 0 refills | Status: DC

## 2021-11-02 NOTE — Telephone Encounter (Signed)
Medication Refill - Medication:  ?simvastatin (ZOCOR) 20 MG tablet  ? ?Has the patient contacted their pharmacy? Yes.   ?Contact PCP-going to new pharmacy ? ?Preferred Pharmacy (with phone number or street name):  ?[place], [place] Phone:  [phone]  ?Fax:  [phone]  ?  ? ?Has the patient been seen for an appointment in the last year OR does the patient have an upcoming appointment? Yes.   ? ?Agent: Please be advised that RX refills may take up to 3 business days. We ask that you follow-up with your pharmacy. ?

## 2021-11-03 NOTE — Telephone Encounter (Signed)
Already refilled on 11/02/21, will refuse this request. ? ?Requested Prescriptions  ?Pending Prescriptions Disp Refills  ?? simvastatin (ZOCOR) 20 MG tablet 90 tablet 0  ?  Sig: Take 1 tablet (20 mg total) by mouth every evening.  ?  ? Cardiovascular:  Antilipid - Statins Failed - 11/02/2021  5:30 PM  ?  ?  Failed - Lipid Panel in normal range within the last 12 months  ?  Cholesterol, Total  ?Date Value Ref Range Status  ?12/24/2020 148 100 - 199 mg/dL Final  ? ?LDL Chol Calc (NIH)  ?Date Value Ref Range Status  ?12/24/2020 76 0 - 99 mg/dL Final  ? ?HDL  ?Date Value Ref Range Status  ?12/24/2020 50 >39 mg/dL Final  ? ?Triglycerides  ?Date Value Ref Range Status  ?12/24/2020 124 0 - 149 mg/dL Final  ? ?  ?  ?  Passed - Patient is not pregnant  ?  ?  Passed - Valid encounter within last 12 months  ?  Recent Outpatient Visits   ?      ? 4 months ago CKD (chronic kidney disease) stage 4, GFR 15-29 ml/min (HCC)  ? [facility] Glean Hess, MD  ? 10 months ago CKD (chronic kidney disease) stage 4, GFR 15-29 ml/min ([place])  ? [facility] Glean Hess, MD  ? 1 year ago Generalized anxiety disorder  ? [facility] Glean Hess, MD  ? 1 year ago Essential hypertension  ? [facility] Glean Hess, MD  ? 2 years ago Annual physical exam  ? [facility] Glean Hess, MD  ?  ?  ?Future Appointments   ?        ? In 1 month Army Melia Jesse Sans, MD [facility], [place]  ?  ? ?  ?  ?  ? ? ?

## 2021-11-11 ENCOUNTER — Telehealth: Payer: Self-pay

## 2021-11-11 ENCOUNTER — Other Ambulatory Visit: Payer: Self-pay | Admitting: Internal Medicine

## 2021-11-11 DIAGNOSIS — F411 Generalized anxiety disorder: Secondary | ICD-10-CM

## 2021-11-11 MED ORDER — ALPRAZOLAM 0.25 MG PO TABS: 0.2500 mg | ORAL_TABLET | Freq: Two times a day (BID) | ORAL | 1 refills | Status: DC | PRN

## 2021-11-11 NOTE — Telephone Encounter (Signed)
Patients daughter requested a refill for patient Xanax. Pharmacy: [facility]. ? ? ?

## 2021-11-28 DIAGNOSIS — R809 Proteinuria, unspecified: Secondary | ICD-10-CM | POA: Diagnosis not present

## 2021-11-28 DIAGNOSIS — N184 Chronic kidney disease, stage 4 (severe): Secondary | ICD-10-CM | POA: Diagnosis not present

## 2021-11-28 DIAGNOSIS — I10 Essential (primary) hypertension: Secondary | ICD-10-CM | POA: Diagnosis not present

## 2021-12-08 ENCOUNTER — Other Ambulatory Visit: Payer: Self-pay | Admitting: Internal Medicine

## 2021-12-08 DIAGNOSIS — F324 Major depressive disorder, single episode, in partial remission: Secondary | ICD-10-CM

## 2021-12-09 NOTE — Telephone Encounter (Signed)
Requested medication (s) are due for refill today: yes ? ?Requested medication (s) are on the active medication list: yes ? ?Last refill:  04/29/21 ? ?Future visit scheduled: yes ? ?Notes to clinic:  Unable to refill per protocol, cannot delegate. ? ? ? ?  ?Requested Prescriptions  ?Pending Prescriptions Disp Refills  ? sertraline (ZOLOFT) 100 MG tablet [Pharmacy Med Name: SERTRALINE HCL 100 MG TAB] 135 tablet 1  ?  Sig: TAKE 1 AND 1/2 TABLETS BY MOUTH ONCE DAILY  ?  ? Not Delegated - Psychiatry:  Antidepressants - SSRI - sertraline Failed - 12/08/2021  9:25 AM  ?  ?  Failed - This refill cannot be delegated  ?  ?  Passed - AST in normal range and within 360 days  ?  AST  ?Date Value Ref Range Status  ?12/24/2020 15 0 - 40 IU/L Final  ?  ?  ?  ?  Passed - ALT in normal range and within 360 days  ?  ALT  ?Date Value Ref Range Status  ?12/24/2020 6 0 - 32 IU/L Final  ?  ?  ?  ?  Passed - Completed PHQ-2 or PHQ-9 in the last 360 days  ?  ?  Passed - Valid encounter within last 6 months  ?  Recent Outpatient Visits   ? ?      ? 5 months ago CKD (chronic kidney disease) stage 4, GFR 15-29 ml/min (HCC)  ? [facility] Glean Hess, MD  ? 11 months ago CKD (chronic kidney disease) stage 4, GFR 15-29 ml/min ([place])  ? [facility] Glean Hess, MD  ? 1 year ago Generalized anxiety disorder  ? [facility] Glean Hess, MD  ? 1 year ago Essential hypertension  ? [facility] Glean Hess, [facility]  ? 2 years ago Annual physical exam  ? [facility] Glean Hess, MD  ? ?  ?  ?Future Appointments   ? ?        ? In 3 weeks Glean Hess, MD [facility] [place]  ? ?  ? ? ?  ?  ?  ? ? ?

## 2021-12-27 ENCOUNTER — Other Ambulatory Visit: Payer: Self-pay | Admitting: Internal Medicine

## 2021-12-27 DIAGNOSIS — I10 Essential (primary) hypertension: Secondary | ICD-10-CM

## 2021-12-28 NOTE — Telephone Encounter (Signed)
Requested medications are due for refill today.  yes ? ?Requested medications are on the active medications list.  yes ? ?Last refill. Both refilled 07/01/2021 #90 1 refill ? ?Future visit scheduled.   yes ? ?Notes to clinic.  Labs are expired. ? ? ? ?Requested Prescriptions  ?Pending Prescriptions Disp Refills  ? losartan (COZAAR) 100 MG tablet [Pharmacy Med Name: LOSARTAN POTASSIUM 100 MG TAB] 90 tablet 1  ?  Sig: TAKE 1 TABLET BY MOUTH DAILY  ?  ? Cardiovascular:  Angiotensin Receptor Blockers Failed - 12/27/2021  1:08 PM  ?  ?  Failed - Cr in normal range and within 180 days  ?  Creatinine, Ser  ?Date Value Ref Range Status  ?12/24/2020 1.87 (H) 0.57 - 1.00 mg/dL Final  ?   ?  ?  Failed - K in normal range and within 180 days  ?  Potassium  ?Date Value Ref Range Status  ?12/24/2020 4.2 3.5 - 5.2 mmol/L Final  ?12/28/2011 3.4 (L) 3.5 - 5.1 mmol/L Final  ?   ?  ?  Passed - Patient is not pregnant  ?  ?  Passed - Last BP in normal range  ?  BP Readings from Last 1 Encounters:  ?07/01/21 118/62  ?   ?  ?  Passed - Valid encounter within last 6 months  ?  Recent Outpatient Visits   ? ?      ? 6 months ago CKD (chronic kidney disease) stage 4, GFR 15-29 ml/min (HCC)  ? [facility], [facility]  ? 1 year ago CKD (chronic kidney disease) stage 4, GFR 15-29 ml/min ([place])  ? [facility] Glean Hess, MD  ? 1 year ago Generalized anxiety disorder  ? [facility] Glean Hess, MD  ? 1 year ago Essential hypertension  ? [facility] Glean Hess, MD  ? 2 years ago Annual physical exam  ? [facility] Glean Hess, MD  ? ?  ?  ?Future Appointments   ? ?        ? In 1 month Army Melia Jesse Sans, MD [facility]  ? ?  ? ? ?  ?  ?  ? triamterene-hydrochlorothiazide (MAXZIDE-25) 37.5-25 MG tablet [Pharmacy Med Name: TRIAMTERENE-HCTZ 37.5-25 MG TAB] 90 tablet 1  ?  Sig: Take 1 tablet by mouth daily.  ?  ? Cardiovascular: Diuretic Combos Failed -  12/27/2021  1:08 PM  ?  ?  Failed - K in normal range and within 180 days  ?  Potassium  ?Date Value Ref Range Status  ?12/24/2020 4.2 3.5 - 5.2 mmol/L Final  ?12/28/2011 3.4 (L) 3.5 - 5.1 mmol/L Final  ?   ?  ?  Failed - Na in normal range and within 180 days  ?  Sodium  ?Date Value Ref Range Status  ?12/24/2020 141 134 - 144 mmol/L Final  ?   ?  ?  Failed - Cr in normal range and within 180 days  ?  Creatinine, Ser  ?Date Value Ref Range Status  ?12/24/2020 1.87 (H) 0.57 - 1.00 mg/dL Final  ?   ?  ?  Passed - Last BP in normal range  ?  BP Readings from Last 1 Encounters:  ?07/01/21 118/62  ?   ?  ?  Passed - Valid encounter within last 6 months  ?  Recent Outpatient Visits   ? ?      ?  6 months ago CKD (chronic kidney disease) stage 4, GFR 15-29 ml/min (HCC)  ? [facility] Glean Hess, MD  ? 1 year ago CKD (chronic kidney disease) stage 4, GFR 15-29 ml/min ([place])  ? [facility] Glean Hess, MD  ? 1 year ago Generalized anxiety disorder  ? [facility], [facility]  ? 1 year ago Essential hypertension  ? [facility] Glean Hess, MD  ? 2 years ago Annual physical exam  ? [facility] Glean Hess, MD  ? ?  ?  ?Future Appointments   ? ?        ? In 1 month Army Melia Jesse Sans, MD [facility], [place]  ? ?  ? ? ?  ?  ?  ?  ?

## 2021-12-30 ENCOUNTER — Encounter: Payer: PPO | Admitting: Internal Medicine

## 2022-02-07 ENCOUNTER — Other Ambulatory Visit: Payer: Self-pay | Admitting: Internal Medicine

## 2022-02-07 DIAGNOSIS — F324 Major depressive disorder, single episode, in partial remission: Secondary | ICD-10-CM

## 2022-02-10 ENCOUNTER — Ambulatory Visit (INDEPENDENT_AMBULATORY_CARE_PROVIDER_SITE_OTHER): Payer: PPO | Admitting: Internal Medicine

## 2022-02-10 ENCOUNTER — Encounter: Payer: Self-pay | Admitting: Internal Medicine

## 2022-02-10 VITALS — BP 128/70 | HR 67 | Ht 64.0 in | Wt 164.0 lb

## 2022-02-10 DIAGNOSIS — E785 Hyperlipidemia, unspecified: Secondary | ICD-10-CM | POA: Diagnosis not present

## 2022-02-10 DIAGNOSIS — Z1231 Encounter for screening mammogram for malignant neoplasm of breast: Secondary | ICD-10-CM | POA: Diagnosis not present

## 2022-02-10 DIAGNOSIS — Z0000 Encounter for general adult medical examination without abnormal findings: Secondary | ICD-10-CM | POA: Diagnosis not present

## 2022-02-10 DIAGNOSIS — F324 Major depressive disorder, single episode, in partial remission: Secondary | ICD-10-CM | POA: Diagnosis not present

## 2022-02-10 DIAGNOSIS — N2581 Secondary hyperparathyroidism of renal origin: Secondary | ICD-10-CM

## 2022-02-10 DIAGNOSIS — I10 Essential (primary) hypertension: Secondary | ICD-10-CM | POA: Diagnosis not present

## 2022-02-10 DIAGNOSIS — Z1159 Encounter for screening for other viral diseases: Secondary | ICD-10-CM

## 2022-02-10 DIAGNOSIS — N184 Chronic kidney disease, stage 4 (severe): Secondary | ICD-10-CM | POA: Diagnosis not present

## 2022-02-10 DIAGNOSIS — R7303 Prediabetes: Secondary | ICD-10-CM | POA: Diagnosis not present

## 2022-02-10 DIAGNOSIS — Z13820 Encounter for screening for osteoporosis: Secondary | ICD-10-CM

## 2022-02-10 NOTE — Progress Notes (Signed)
Date:  02/10/2022   Name:  Veronica Rowe   DOB:  12-Nov-1940   MRN:  [id number]   Chief Complaint: Annual Exam (Feeling well. Sleeping well. Doing yard work. ) Veronica Rowe is a 81 y.o. female who presents today for her Complete Annual Exam. She feels well. She reports exercising doing yard work. She reports she is sleeping well. Breast complaints none.  She is coping well since her husband's death with support of family.  Mammogram: 05/2021 DEXA: 10/2010 normal Pap smear: discontinued Colonoscopy: aged out  Health Maintenance Due  Topic Date Due   Hepatitis C Screening  Never done   Zoster Vaccines- Shingrix (1 of 2) Never done    Immunization History  Administered Date(s) Administered   Fluad Quad(high Dose 65+) 07/04/2019   Pneumococcal Conjugate-13 01/04/2016   Pneumococcal Polysaccharide-23 10/05/2011   Tdap 10/28/2013    Hypertension This is a chronic problem. The problem is controlled. Pertinent negatives include no chest pain, headaches, palpitations or shortness of breath. Past treatments include angiotensin blockers and diuretics. The current treatment provides significant improvement. Hypertensive end-organ damage includes kidney disease. There is no history of CAD/MI or CVA.  Hyperlipidemia This is a chronic problem. The problem is controlled. Pertinent negatives include no chest pain or shortness of breath. Current antihyperlipidemic treatment includes statins. There are no compliance problems.   Depression        This is a chronic problem.  The problem has been resolved since onset.  Associated symptoms include no fatigue and no headaches.  Past treatments include SSRIs - Selective serotonin reuptake inhibitors and other medications (zoloft and remeron).  Compliance with treatment is good.   Lab Results  Component Value Date   NA 141 12/24/2020   K 4.2 12/24/2020   CO2 25 12/24/2020   GLUCOSE 91 12/24/2020   BUN 38 (H) 12/24/2020   CREATININE 1.87 (H)  12/24/2020   CALCIUM 9.6 12/24/2020   EGFR 27 (L) 12/24/2020   GFRNONAA 34 (L) 05/07/2020   Lab Results  Component Value Date   CHOL 148 12/24/2020   HDL 50 12/24/2020   LDLCALC 76 12/24/2020   TRIG 124 12/24/2020   CHOLHDL 3.0 12/24/2020   Lab Results  Component Value Date   TSH 2.240 12/24/2020   No results found for: "HGBA1C" Lab Results  Component Value Date   WBC 8.3 12/24/2020   HGB 13.2 12/24/2020   HCT 39.5 12/24/2020   MCV 84 12/24/2020   PLT 251 12/24/2020   Lab Results  Component Value Date   ALT 6 12/24/2020   AST 15 12/24/2020   ALKPHOS 90 12/24/2020   BILITOT 0.5 12/24/2020   Lab Results  Component Value Date   VD25OH 57.4 12/24/2020     Review of Systems  Constitutional:  Negative for chills, fatigue and fever.  HENT:  Negative for congestion, hearing loss, tinnitus, trouble swallowing and voice change.   Eyes:  Negative for visual disturbance.  Respiratory:  Negative for cough, chest tightness, shortness of breath and wheezing.   Cardiovascular:  Negative for chest pain, palpitations and leg swelling.  Gastrointestinal:  Negative for abdominal pain, constipation, diarrhea and vomiting.  Endocrine: Negative for polydipsia and polyuria.  Genitourinary:  Negative for dysuria, frequency, genital sores, vaginal bleeding and vaginal discharge.  Musculoskeletal:  Negative for arthralgias, gait problem and joint swelling.  Skin:  Negative for color change and rash.  Neurological:  Negative for dizziness, tremors, light-headedness and headaches.  Hematological:  Negative  for adenopathy. Does not bruise/bleed easily.  Psychiatric/Behavioral:  Positive for depression. Negative for dysphoric mood and sleep disturbance. The patient is not nervous/anxious.     Patient Active Problem List   Diagnosis Date Noted   Osteoarthritis of fingers of both hands 01/02/2020   Lumbar spondylosis 03/27/2016   Vitamin D deficiency 08/17/2015   Essential hypertension  07/07/2015   Major depressive disorder with single episode, in partial remission ([place]) 07/07/2015   CKD (chronic kidney disease) stage 4, GFR 15-29 ml/min ([place]) 06/26/2015   Hx of peptic ulcer 06/26/2015   Generalized psoriasis 06/26/2015   Stress incontinence, female 06/26/2015   Dyslipidemia 06/26/2015   Generalized anxiety disorder 06/26/2015    Allergies  Allergen Reactions   Codeine Itching and Other (See Comments)    hallucinations    Past Surgical History:  Procedure Laterality Date   BREAST EXCISIONAL BIOPSY Right 2013   The patient's Baker Janus model risk for breast cancer is 1.4% for the next 5 years, 4.4% lifetime    Social History   Tobacco Use   Smoking status: Never   Smokeless tobacco: Never  Vaping Use   Vaping Use: Never used  Substance Use Topics   Alcohol use: No    Alcohol/week: 0.0 standard drinks of alcohol   Drug use: No     Medication list has been reviewed and updated.  Current Meds  Medication Sig   acetaminophen (TYLENOL) 325 MG tablet Take 650 mg by mouth every 6 (six) hours as needed.   ALPRAZolam (XANAX) 0.25 MG tablet Take 1 tablet (0.25 mg total) by mouth 2 (two) times daily as needed. for anxiety   Ascorbic Acid (VITAMIN C) 500 MG CAPS Take 500 mg by mouth daily.   cholecalciferol (VITAMIN D) 1000 UNITS tablet Take 1,000 Units by mouth daily.   fluticasone (FLONASE) 50 MCG/ACT nasal spray Place 2 sprays into both nostrils daily.   losartan (COZAAR) 100 MG tablet TAKE 1 TABLET BY MOUTH DAILY   mirtazapine (REMERON) 15 MG tablet TAKE 1 TABLET BY MOUTH AT BEDTIME   omeprazole (PRILOSEC) 20 MG capsule TAKE ONE CAPSULE BY MOUTH TWICE DAILY   sertraline (ZOLOFT) 100 MG tablet TAKE 1 AND 1/2 TABLETS BY MOUTH ONCE DAILY   simvastatin (ZOCOR) 20 MG tablet TAKE ONE TABLET BY MOUTH EVERY EVENING   traMADol (ULTRAM) 50 MG tablet TAKE ONE TABLET BY MOUTH ONCE DAILY AS NEEDED FOR SEVERE PAIN   triamterene-hydrochlorothiazide (MAXZIDE-25) 37.5-25 MG  tablet TAKE 1 TABLET BY MOUTH DAILY   zinc gluconate 50 MG tablet Take 50 mg by mouth daily.       02/10/2022   10:50 AM 07/01/2021   10:32 AM 12/24/2020   11:02 AM 05/07/2020    9:13 AM  GAD 7 : Generalized Anxiety Score  Nervous, Anxious, on Edge 1 0 1 0  Control/stop worrying 1 0 3 0  Worry too much - different things 1 0 3 0  Trouble relaxing 0 0 1 0  Restless 0 0 0 0  Easily annoyed or irritable 0 0 0 0  Afraid - awful might happen 1 0 1 0  Total GAD 7 Score 4 0 9 0  Anxiety Difficulty Not difficult at all Not difficult at all Not difficult at all Not difficult at all       02/10/2022   10:50 AM 07/01/2021   10:32 AM 12/24/2020   11:01 AM  Depression screen PHQ 2/9  Decreased Interest 0 0 0  Down, Depressed, Hopeless 0  0 1  PHQ - 2 Score 0 0 1  Altered sleeping 0 0 1  Tired, decreased energy 0 1 2  Change in appetite 0 0 0  Feeling bad or failure about yourself  0 0 0  Trouble concentrating 0 0 0  Moving slowly or fidgety/restless 0 0 0  Suicidal thoughts 0 0 0  PHQ-9 Score 0 1 4  Difficult doing work/chores Not difficult at all Not difficult at all Not difficult at all    BP Readings from Last 3 Encounters:  02/10/22 128/70  07/01/21 118/62  12/24/20 132/64    Physical Exam Vitals and nursing note reviewed.  Constitutional:      General: She is not in acute distress.    Appearance: She is well-developed.  HENT:     Head: Normocephalic and atraumatic.     Right Ear: Tympanic membrane and ear canal normal. Decreased hearing noted.     Left Ear: Tympanic membrane and ear canal normal. Decreased hearing noted.     Nose:     Right Sinus: No maxillary sinus tenderness.     Left Sinus: No maxillary sinus tenderness.  Eyes:     General: No scleral icterus.       Right eye: No discharge.        Left eye: No discharge.     Conjunctiva/sclera: Conjunctivae normal.  Neck:     Thyroid: No thyromegaly.     Vascular: No carotid bruit.  Cardiovascular:     Rate  and Rhythm: Normal rate and regular rhythm.     Pulses: Normal pulses.     Heart sounds: Normal heart sounds.  Pulmonary:     Effort: Pulmonary effort is normal. No respiratory distress.     Breath sounds: No wheezing.  Chest:  Breasts:    Right: No mass, nipple discharge, skin change or tenderness.     Left: No mass, nipple discharge, skin change or tenderness.  Abdominal:     General: Bowel sounds are normal.     Palpations: Abdomen is soft.     Tenderness: There is no abdominal tenderness.  Musculoskeletal:     Cervical back: Normal range of motion. No erythema.     Right lower leg: No edema.     Left lower leg: No edema.  Lymphadenopathy:     Cervical: No cervical adenopathy.  Skin:    General: Skin is warm and dry.     Capillary Refill: Capillary refill takes less than 2 seconds.     Findings: No rash.  Neurological:     General: No focal deficit present.     Mental Status: She is alert and oriented to person, place, and time.     Cranial Nerves: No cranial nerve deficit.     Sensory: No sensory deficit.     Deep Tendon Reflexes: Reflexes are normal and symmetric.  Psychiatric:        Attention and Perception: Attention normal.        Mood and Affect: Mood normal.     Wt Readings from Last 3 Encounters:  02/10/22 164 lb (74.4 kg)  07/01/21 162 lb 12.8 oz (73.8 kg)  12/24/20 164 lb (74.4 kg)    BP 128/70   Pulse 67   Ht 5' 4"  (1.626 m)   Wt 164 lb (74.4 kg)   SpO2 98%   BMI 28.15 kg/m   Assessment and Plan: 1. Annual physical exam Normal exam. Up to date on screenings and immunizations. -  Hemoglobin A1c  2. Need for hepatitis C screening test - Hepatitis C antibody  3. Essential hypertension Clinically stable exam with well controlled BP. Tolerating medications without side effects at this time. Pt to continue current regimen and low sodium diet; benefits of regular exercise as able discussed. - CBC with Differential/Platelet - Comprehensive  metabolic panel - Urinalysis, Routine w reflex microscopic  4. Major depressive disorder with single episode, in partial remission (HCC) Clinically stable on current regimen with good control of symptoms, No SI or HI. Will continue current therapy. - TSH  5. Dyslipidemia Check labs; continue zocor - Lipid panel  6. CKD (chronic kidney disease) stage 4, GFR 15-29 ml/min (HCC) Followed by nephrology Recent PTH elevated and Vitamin D normal. - Comprehensive metabolic panel  7. Secondary hyperparathyroidism of renal origin ([place]) As above  8. Encounter for screening mammogram for breast cancer Schedule in October at [facility]  9. Encounter for screening for osteoporosis Schedule with Mammogram at [facility]   Partially dictated using Editor, commissioning. Any errors are unintentional.  Halina Maidens, MD [facility]  02/10/2022

## 2022-02-11 LAB — CBC WITH DIFFERENTIAL/PLATELET
Basophils Absolute: 0.1 10*3/uL (ref 0.0–0.2)
Basos: 1 %
EOS (ABSOLUTE): 0.2 10*3/uL (ref 0.0–0.4)
Eos: 3 %
Hematocrit: 43.2 % (ref 34.0–46.6)
Hemoglobin: 13.9 g/dL (ref 11.1–15.9)
Immature Grans (Abs): 0 10*3/uL (ref 0.0–0.1)
Immature Granulocytes: 0 %
Lymphocytes Absolute: 1.4 10*3/uL (ref 0.7–3.1)
Lymphs: 17 %
MCH: 26.7 pg (ref 26.6–33.0)
MCHC: 32.2 g/dL (ref 31.5–35.7)
MCV: 83 fL (ref 79–97)
Monocytes Absolute: 0.6 10*3/uL (ref 0.1–0.9)
Monocytes: 8 %
Neutrophils Absolute: 6 10*3/uL (ref 1.4–7.0)
Neutrophils: 71 %
Platelets: 266 10*3/uL (ref 150–450)
RBC: 5.2 x10E6/uL (ref 3.77–5.28)
RDW: 13.1 % (ref 11.7–15.4)
WBC: 8.3 10*3/uL (ref 3.4–10.8)

## 2022-02-11 LAB — COMPREHENSIVE METABOLIC PANEL
ALT: 9 IU/L (ref 0–32)
AST: 17 IU/L (ref 0–40)
Albumin/Globulin Ratio: 1.7 (ref 1.2–2.2)
Albumin: 4.8 g/dL — ABNORMAL HIGH (ref 3.7–4.7)
Alkaline Phosphatase: 91 IU/L (ref 44–121)
BUN/Creatinine Ratio: 23 (ref 12–28)
BUN: 32 mg/dL — ABNORMAL HIGH (ref 8–27)
Bilirubin Total: 0.4 mg/dL (ref 0.0–1.2)
CO2: 22 mmol/L (ref 20–29)
Calcium: 10.1 mg/dL (ref 8.7–10.3)
Chloride: 101 mmol/L (ref 96–106)
Creatinine, Ser: 1.38 mg/dL — ABNORMAL HIGH (ref 0.57–1.00)
Globulin, Total: 2.9 g/dL (ref 1.5–4.5)
Glucose: 93 mg/dL (ref 70–99)
Potassium: 4.5 mmol/L (ref 3.5–5.2)
Sodium: 143 mmol/L (ref 134–144)
Total Protein: 7.7 g/dL (ref 6.0–8.5)
eGFR: 39 mL/min/{1.73_m2} — ABNORMAL LOW (ref >59)

## 2022-02-11 LAB — LIPID PANEL
Chol/HDL Ratio: 2.8 ratio (ref 0.0–4.4)
Cholesterol, Total: 176 mg/dL (ref 100–199)
HDL: 62 mg/dL (ref >39)
LDL Chol Calc (NIH): 93 mg/dL (ref 0–99)
Triglycerides: 118 mg/dL (ref 0–149)
VLDL Cholesterol Cal: 21 mg/dL (ref 5–40)

## 2022-02-11 LAB — URINALYSIS, ROUTINE W REFLEX MICROSCOPIC
Bilirubin, UA: NEGATIVE
Glucose, UA: NEGATIVE
Ketones, UA: NEGATIVE
Nitrite, UA: POSITIVE — AB
Protein,UA: NEGATIVE
Specific Gravity, UA: 1.017 (ref 1.005–1.030)
Urobilinogen, Ur: 0.2 mg/dL (ref 0.2–1.0)
pH, UA: 6 (ref 5.0–7.5)

## 2022-02-11 LAB — HEMOGLOBIN A1C
Est. average glucose Bld gHb Est-mCnc: 117 mg/dL
Hgb A1c MFr Bld: 5.7 % — ABNORMAL HIGH (ref 4.8–5.6)

## 2022-02-11 LAB — MICROSCOPIC EXAMINATION
Casts: NONE SEEN /lpf
RBC, Urine: NONE SEEN /hpf (ref 0–2)

## 2022-02-11 LAB — TSH: TSH: 1.62 u[IU]/mL (ref 0.450–4.500)

## 2022-02-11 LAB — HEPATITIS C ANTIBODY: Hep C Virus Ab: NONREACTIVE

## 2022-03-02 ENCOUNTER — Telehealth: Payer: Self-pay

## 2022-03-02 NOTE — Telephone Encounter (Signed)
Called pt as a reminder to call and schedule mammogram and bone density. Pt verbalized understanding.  KP

## 2022-03-16 ENCOUNTER — Other Ambulatory Visit: Payer: Self-pay | Admitting: Internal Medicine

## 2022-03-16 DIAGNOSIS — F411 Generalized anxiety disorder: Secondary | ICD-10-CM

## 2022-03-17 NOTE — Telephone Encounter (Signed)
Please review. Last office visit 02/10/22.  KP

## 2022-03-17 NOTE — Telephone Encounter (Signed)
Requested medication (s) are due for refill today:   Provider to review  Requested medication (s) are on the active medication list:   Yes  Future visit scheduled:   Yes   Last ordered: 11/11/2021 #60, 1 refill  Returned because it's a non delegated refill    Requested Prescriptions  Pending Prescriptions Disp Refills   ALPRAZolam (XANAX) 0.25 MG tablet [Pharmacy Med Name: ALPRAZOLAM 0.25 MG TAB] 60 tablet     Sig: TAKE 1 TABLET BY MOUTH 2 TIMES DAILY AS NEEDED FOR ANXIETY     Not Delegated - Psychiatry: Anxiolytics/Hypnotics 2 Failed - 03/16/2022  1:43 PM      Failed - This refill cannot be delegated      Failed - Urine Drug Screen completed in last 360 days      Passed - Patient is not pregnant      Passed - Valid encounter within last 6 months    Recent Outpatient Visits           1 month ago Annual physical exam   [facility] Glean Hess, MD   8 months ago CKD (chronic kidney disease) stage 4, GFR 15-29 ml/min [facility])   [facility] Glean Hess, MD   1 year ago CKD (chronic kidney disease) stage 4, GFR 15-29 ml/min [facility])   [facility] Glean Hess, MD   1 year ago Generalized anxiety disorder   [facility] Glean Hess, MD   2 years ago Essential hypertension   [facility] Glean Hess, MD       Future Appointments             In 11 months Army Melia Jesse Sans, MD [facility], [facility]

## 2022-04-04 ENCOUNTER — Other Ambulatory Visit: Payer: Self-pay | Admitting: Internal Medicine

## 2022-04-04 DIAGNOSIS — I10 Essential (primary) hypertension: Secondary | ICD-10-CM

## 2022-04-05 NOTE — Telephone Encounter (Signed)
Requested Prescriptions  Pending Prescriptions Disp Refills  . triamterene-hydrochlorothiazide (MAXZIDE-25) 37.5-25 MG tablet [Pharmacy Med Name: TRIAMTERENE-HCTZ 37.5-25 MG TAB] 90 tablet 3    Sig: TAKE 1 TABLET BY MOUTH DAILY     Cardiovascular: Diuretic Combos Failed - 04/04/2022 11:48 AM      Failed - Cr in normal range and within 180 days    Creatinine, Ser  Date Value Ref Range Status  02/10/2022 1.38 (H) 0.57 - 1.00 mg/dL Final         Passed - K in normal range and within 180 days    Potassium  Date Value Ref Range Status  02/10/2022 4.5 3.5 - 5.2 mmol/L Final  12/28/2011 3.4 (L) 3.5 - 5.1 mmol/L Final         Passed - Na in normal range and within 180 days    Sodium  Date Value Ref Range Status  02/10/2022 143 134 - 144 mmol/L Final         Passed - Last BP in normal range    BP Readings from Last 1 Encounters:  02/10/22 128/70         Passed - Valid encounter within last 6 months    Recent Outpatient Visits          1 month ago Annual physical exam   [place] [facility] at [facility], Jesse Sans, MD   9 months ago CKD (chronic kidney disease) stage 4, GFR 15-29 ml/min ([place])   [place] [facility] at [facility], Jesse Sans, MD   1 year ago CKD (chronic kidney disease) stage 4, GFR 15-29 ml/min ([place])   [place] [facility] at [facility], Jesse Sans, MD   1 year ago Generalized anxiety disorder   [place] [facility] at [facility], Jesse Sans, MD   2 years ago Essential hypertension   [place] [facility] at [facility], Jesse Sans, MD      Future Appointments            In 11 months Army Melia Jesse Sans, MD [facility] Care [facility] at [facility], [place]           . losartan (COZAAR) 100 MG tablet [Pharmacy Med Name: LOSARTAN POTASSIUM 100 MG TAB] 90 tablet 3     Sig: TAKE 1 TABLET BY MOUTH DAILY     Cardiovascular:  Angiotensin Receptor Blockers Failed - 04/04/2022 11:48 AM      Failed - Cr in normal range and within 180 days    Creatinine, Ser  Date Value Ref Range Status  02/10/2022 1.38 (H) 0.57 - 1.00 mg/dL Final         Passed - K in normal range and within 180 days    Potassium  Date Value Ref Range Status  02/10/2022 4.5 3.5 - 5.2 mmol/L Final  12/28/2011 3.4 (L) 3.5 - 5.1 mmol/L Final         Passed - Patient is not pregnant      Passed - Last BP in normal range    BP Readings from Last 1 Encounters:  02/10/22 128/70         Passed - Valid encounter within last 6 months    Recent Outpatient Visits          1 month ago Annual physical exam   [facility] at [employer], Jesse Sans, MD   9 months ago CKD (chronic kidney disease) stage 4, GFR 15-29 ml/min ([place])   [place] [facility] Care and Sports Medicine at [facility], Jesse Sans, MD   1 year ago CKD (chronic kidney disease) stage 4, GFR 15-29 ml/min ([place])   [place] [facility] Medicine at [facility], Jesse Sans, MD   1 year ago Generalized anxiety disorder   [place] [facility] Medicine at [facility], Jesse Sans, MD   2 years ago Essential hypertension   [place] [facility] at [facility], Jesse Sans, MD      Future Appointments            In 54 months Army Melia Jesse Sans, MD [facility] at [facility], [facility]           . omeprazole (PRILOSEC) 20 MG capsule [Pharmacy Med Name: OMEPRAZOLE DR 20 MG CAP] 180 capsule 3    Sig: TAKE 1 CAPSULE BY MOUTH TWO TIMES DAILY     Gastroenterology: Proton Pump Inhibitors Passed - 04/04/2022 11:48 AM      Passed - Valid encounter within last 12 months    Recent Outpatient Visits          1 month ago Annual physical exam   [place] [facility] Medicine at [facility], Jesse Sans, MD   9 months ago CKD (chronic kidney disease) stage 4, GFR 15-29 ml/min ([place])   [place] [facility] and [facility] at [facility], Jesse Sans, MD   1 year ago CKD (chronic kidney disease) stage 4, GFR 15-29 ml/min ([place])   [place] [facility] and [facility] Medicine at [facility], Jesse Sans, MD   1 year ago Generalized anxiety disorder    Primary Care and Sports Medicine at [facility], Jesse Sans, MD   2 years ago Essential hypertension    Primary Care and Sports Medicine at [facility], Jesse Sans, MD      Future Appointments            In 11 months Army Melia Jesse Sans, MD [place] [facility] and [facility] Medicine at [facility]

## 2022-04-24 DIAGNOSIS — N2581 Secondary hyperparathyroidism of renal origin: Secondary | ICD-10-CM | POA: Diagnosis not present

## 2022-04-24 DIAGNOSIS — I10 Essential (primary) hypertension: Secondary | ICD-10-CM | POA: Diagnosis not present

## 2022-04-24 DIAGNOSIS — N184 Chronic kidney disease, stage 4 (severe): Secondary | ICD-10-CM | POA: Diagnosis not present

## 2022-04-24 DIAGNOSIS — N1832 Chronic kidney disease, stage 3b: Secondary | ICD-10-CM | POA: Diagnosis not present

## 2022-04-24 DIAGNOSIS — R809 Proteinuria, unspecified: Secondary | ICD-10-CM | POA: Diagnosis not present

## 2022-06-01 ENCOUNTER — Other Ambulatory Visit: Payer: Self-pay | Admitting: Internal Medicine

## 2022-06-01 NOTE — Telephone Encounter (Signed)
Requested Prescriptions  Pending Prescriptions Disp Refills  . simvastatin (ZOCOR) 20 MG tablet [Pharmacy Med Name: SIMVASTATIN 20 MG TAB] 90 tablet 2    Sig: TAKE ONE TABLET BY MOUTH EVERY EVENING     Cardiovascular:  Antilipid - Statins Failed - 06/01/2022  1:13 PM      Failed - Lipid Panel in normal range within the last 12 months    Cholesterol, Total  Date Value Ref Range Status  02/10/2022 176 100 - 199 mg/dL Final   LDL Chol Calc (NIH)  Date Value Ref Range Status  02/10/2022 93 0 - 99 mg/dL Final   HDL  Date Value Ref Range Status  02/10/2022 62 >39 mg/dL Final   Triglycerides  Date Value Ref Range Status  02/10/2022 118 0 - 149 mg/dL Final         Passed - Patient is not pregnant      Passed - Valid encounter within last 12 months    Recent Outpatient Visits          3 months ago Annual physical exam   [place] [facility] at [facility], Jesse Sans, MD   11 months ago CKD (chronic kidney disease) stage 4, GFR 15-29 ml/min ([place])   [place] [facility] at [facility], Jesse Sans, MD   1 year ago CKD (chronic kidney disease) stage 4, GFR 15-29 ml/min ([place])   [place] [facility] Medicine at [facility], Jesse Sans, MD   2 years ago Generalized anxiety disorder   [place] [facility] at [facility], Jesse Sans, MD   2 years ago Essential hypertension    Primary Care and Sports Medicine at [facility], Jesse Sans, MD      Future Appointments            In 9 months Army Melia, Jesse Sans, MD [facility] at [facility], [facility]

## 2022-06-12 ENCOUNTER — Ambulatory Visit
Admission: RE | Admit: 2022-06-12 | Discharge: 2022-06-12 | Disposition: A | Discharge status: Home / Self Care | Payer: PPO | Source: Ambulatory Visit | Attending: Internal Medicine | Admitting: Internal Medicine

## 2022-06-12 DIAGNOSIS — Z1231 Encounter for screening mammogram for malignant neoplasm of breast: Secondary | ICD-10-CM | POA: Diagnosis not present

## 2022-06-23 ENCOUNTER — Other Ambulatory Visit: Payer: Self-pay | Admitting: Internal Medicine

## 2022-06-23 DIAGNOSIS — F324 Major depressive disorder, single episode, in partial remission: Secondary | ICD-10-CM

## 2022-06-23 NOTE — Telephone Encounter (Signed)
Requested medication (s) are due for refill today: yes  Requested medication (s) are on the active medication list: yes  Last refill:  zoloft 12/09/21 #135 1 refills, remeron 02/07/22 #90 0 refills  Future visit scheduled: yes in 8 months  Notes to clinic:  no refills remain. Do you want to refill Rxs?     Requested Prescriptions  Pending Prescriptions Disp Refills   sertraline (ZOLOFT) 100 MG tablet [Pharmacy Med Name: SERTRALINE HCL 100 MG TAB] 135 tablet 1    Sig: TAKE 1 AND 1/2 TABLETS BY MOUTH ONCE DAILY     Psychiatry:  Antidepressants - SSRI - sertraline Passed - 06/23/2022 11:07 AM      Passed - AST in normal range and within 360 days    AST  Date Value Ref Range Status  02/10/2022 17 0 - 40 IU/L Final         Passed - ALT in normal range and within 360 days    ALT  Date Value Ref Range Status  02/10/2022 9 0 - 32 IU/L Final         Passed - Completed PHQ-2 or PHQ-9 in the last 360 days      Passed - Valid encounter within last 6 months    Recent Outpatient Visits           4 months ago Annual physical exam   [place] [facility] at [facility], Jesse Sans, MD   11 months ago CKD (chronic kidney disease) stage 4, GFR 15-29 ml/min ([place])   [place] [facility] at [facility], Jesse Sans, MD   1 year ago CKD (chronic kidney disease) stage 4, GFR 15-29 ml/min ([place])   [place] [facility] Medicine at [facility], Jesse Sans, MD   2 years ago Generalized anxiety disorder   [place] [facility] and Sports Medicine at [facility], Jesse Sans, MD   2 years ago Essential hypertension   [place] [facility] Medicine at [facility], Jesse Sans, MD       Future Appointments             In 8 months Army Melia, Jesse Sans, MD [facility] Primary Care and Sports Medicine at [facility], [place]             mirtazapine (REMERON) 15 MG  tablet [Pharmacy Med Name: MIRTAZAPINE 15 MG TAB] 90 tablet 0    Sig: TAKE 1 TABLET BY MOUTH AT BEDTIME     Psychiatry: Antidepressants - mirtazapine Passed - 06/23/2022 11:07 AM      Passed - Completed PHQ-2 or PHQ-9 in the last 360 days      Passed - Valid encounter within last 6 months    Recent Outpatient Visits           4 months ago Annual physical exam   [place] [facility] at [facility], Jesse Sans, MD   11 months ago CKD (chronic kidney disease) stage 4, GFR 15-29 ml/min ([place])   [place] [facility] at [facility], Jesse Sans, MD   1 year ago CKD (chronic kidney disease) stage 4, GFR 15-29 ml/min ([place])   [place] [facility] Medicine at [facility], Jesse Sans, MD   2 years ago Generalized anxiety disorder   [facility] Primary Care and Sports Medicine at [facility]  Robert Bellow, MD   2 years ago Essential hypertension   [place] [facility] Medicine at [facility], Jesse Sans, MD       Future Appointments             In 8 months Army Melia Jesse Sans, MD [facility] Primary Care and Sports Medicine at [facility], [facility]

## 2022-06-26 ENCOUNTER — Ambulatory Visit
Admission: RE | Admit: 2022-06-26 | Discharge: 2022-06-26 | Disposition: A | Discharge status: Home / Self Care | Payer: PPO | Source: Ambulatory Visit | Attending: Internal Medicine | Admitting: Internal Medicine

## 2022-06-26 DIAGNOSIS — Z13820 Encounter for screening for osteoporosis: Secondary | ICD-10-CM | POA: Insufficient documentation

## 2022-06-26 DIAGNOSIS — Z780 Asymptomatic menopausal state: Secondary | ICD-10-CM | POA: Diagnosis not present

## 2022-06-26 DIAGNOSIS — N289 Disorder of kidney and ureter, unspecified: Secondary | ICD-10-CM | POA: Insufficient documentation

## 2022-06-26 DIAGNOSIS — M85832 Other specified disorders of bone density and structure, left forearm: Secondary | ICD-10-CM | POA: Diagnosis not present

## 2022-06-26 DIAGNOSIS — E559 Vitamin D deficiency, unspecified: Secondary | ICD-10-CM | POA: Diagnosis not present

## 2022-07-28 ENCOUNTER — Telehealth: Payer: Self-pay | Admitting: Internal Medicine

## 2022-07-28 NOTE — Telephone Encounter (Signed)
Copied from [place] [id number]. Topic: Medicare AWV >> Jul 28, 2022  2:05 PM Devoria Glassing wrote: Reason for CRM: Left message tor patient to schedule Medicare Annual Wellness Visit (AWV) with [facility] Health Advisor.  Appointment can be an offiice/telephone or virtual visit;  Please call [phone] ask for [facility].

## 2022-08-28 DIAGNOSIS — I10 Essential (primary) hypertension: Secondary | ICD-10-CM | POA: Diagnosis not present

## 2022-08-28 DIAGNOSIS — N2581 Secondary hyperparathyroidism of renal origin: Secondary | ICD-10-CM | POA: Diagnosis not present

## 2022-08-28 DIAGNOSIS — R809 Proteinuria, unspecified: Secondary | ICD-10-CM | POA: Diagnosis not present

## 2022-08-28 DIAGNOSIS — N1832 Chronic kidney disease, stage 3b: Secondary | ICD-10-CM | POA: Diagnosis not present

## 2022-09-27 ENCOUNTER — Other Ambulatory Visit: Payer: Self-pay | Admitting: Internal Medicine

## 2022-09-27 DIAGNOSIS — F324 Major depressive disorder, single episode, in partial remission: Secondary | ICD-10-CM

## 2022-12-25 DIAGNOSIS — N1832 Chronic kidney disease, stage 3b: Secondary | ICD-10-CM | POA: Diagnosis not present

## 2022-12-25 DIAGNOSIS — I10 Essential (primary) hypertension: Secondary | ICD-10-CM | POA: Diagnosis not present

## 2022-12-25 DIAGNOSIS — N2581 Secondary hyperparathyroidism of renal origin: Secondary | ICD-10-CM | POA: Diagnosis not present

## 2022-12-25 DIAGNOSIS — R809 Proteinuria, unspecified: Secondary | ICD-10-CM | POA: Diagnosis not present

## 2023-02-22 ENCOUNTER — Encounter: Payer: Self-pay | Admitting: Internal Medicine

## 2023-03-02 ENCOUNTER — Encounter: Payer: PPO | Admitting: Internal Medicine

## 2023-03-07 ENCOUNTER — Telehealth: Payer: Self-pay | Admitting: Internal Medicine

## 2023-03-07 NOTE — Telephone Encounter (Signed)
Copied from CRM [id number]. Topic: Medicare AWV >> Mar 07, 2023  3:05 PM Payton Doughty wrote: Reason for CRM: LM 03/07/2023 to schedule AWV   Verlee Rossetti; Care Guide Ambulatory Clinical Support [employer] [facility] Direct Dial: [phone]

## 2023-03-20 ENCOUNTER — Other Ambulatory Visit: Payer: Self-pay | Admitting: Internal Medicine

## 2023-04-04 ENCOUNTER — Other Ambulatory Visit: Payer: Self-pay | Admitting: Internal Medicine

## 2023-04-04 DIAGNOSIS — F324 Major depressive disorder, single episode, in partial remission: Secondary | ICD-10-CM

## 2023-04-11 ENCOUNTER — Encounter: Payer: PPO | Admitting: Internal Medicine

## 2023-04-13 ENCOUNTER — Ambulatory Visit (INDEPENDENT_AMBULATORY_CARE_PROVIDER_SITE_OTHER): Payer: PPO | Admitting: Internal Medicine

## 2023-04-13 ENCOUNTER — Encounter: Payer: Self-pay | Admitting: Internal Medicine

## 2023-04-13 VITALS — BP 102/68 | HR 88 | Ht 64.0 in | Wt 164.0 lb

## 2023-04-13 DIAGNOSIS — Z0000 Encounter for general adult medical examination without abnormal findings: Secondary | ICD-10-CM | POA: Diagnosis not present

## 2023-04-13 DIAGNOSIS — F411 Generalized anxiety disorder: Secondary | ICD-10-CM | POA: Diagnosis not present

## 2023-04-13 DIAGNOSIS — N184 Chronic kidney disease, stage 4 (severe): Secondary | ICD-10-CM | POA: Diagnosis not present

## 2023-04-13 DIAGNOSIS — E559 Vitamin D deficiency, unspecified: Secondary | ICD-10-CM

## 2023-04-13 DIAGNOSIS — E785 Hyperlipidemia, unspecified: Secondary | ICD-10-CM

## 2023-04-13 DIAGNOSIS — Z8711 Personal history of peptic ulcer disease: Secondary | ICD-10-CM

## 2023-04-13 DIAGNOSIS — Z1231 Encounter for screening mammogram for malignant neoplasm of breast: Secondary | ICD-10-CM

## 2023-04-13 DIAGNOSIS — F324 Major depressive disorder, single episode, in partial remission: Secondary | ICD-10-CM | POA: Diagnosis not present

## 2023-04-13 DIAGNOSIS — I10 Essential (primary) hypertension: Secondary | ICD-10-CM | POA: Diagnosis not present

## 2023-04-13 DIAGNOSIS — N2581 Secondary hyperparathyroidism of renal origin: Secondary | ICD-10-CM | POA: Diagnosis not present

## 2023-04-13 MED ORDER — ALPRAZOLAM 0.25 MG PO TABS: 0.2500 mg | ORAL_TABLET | Freq: Two times a day (BID) | ORAL | 0 refills | Status: DC | PRN

## 2023-04-13 MED ORDER — TRIAMTERENE-HCTZ 37.5-25 MG PO TABS
1.0000 | ORAL_TABLET | Freq: Every day | ORAL | 3 refills | Status: DC
Start: 2023-04-13 — End: 2024-05-08

## 2023-04-13 MED ORDER — LOSARTAN POTASSIUM 100 MG PO TABS
100.0000 mg | ORAL_TABLET | Freq: Every day | ORAL | 3 refills | Status: DC
Start: 2023-04-13 — End: 2024-05-08

## 2023-04-13 MED ORDER — OMEPRAZOLE 20 MG PO CPDR: 20.0000 mg | DELAYED_RELEASE_CAPSULE | Freq: Two times a day (BID) | ORAL | 3 refills | Status: DC

## 2023-04-13 NOTE — Assessment & Plan Note (Signed)
On daily supplement 

## 2023-04-13 NOTE — Assessment & Plan Note (Signed)
On daily PPI

## 2023-04-13 NOTE — Assessment & Plan Note (Signed)
Being followed closely.

## 2023-04-13 NOTE — Assessment & Plan Note (Signed)
Continues on PRN Xanax

## 2023-04-13 NOTE — Assessment & Plan Note (Signed)
LDL is  Lab Results  Component Value Date   LDLCALC 93 02/10/2022   Currently being treated with simvastatin with good compliance and no concerns.

## 2023-04-13 NOTE — Assessment & Plan Note (Signed)
Clinically stable on current regimen with good control of symptoms, No SI or HI. No change in management at this time. Sertraline and Remeron

## 2023-04-13 NOTE — Progress Notes (Signed)
Date:  04/13/2023   Name:  Veronica Rowe   DOB:  12-19-1940   MRN:  [id number]   Chief Complaint: Annual Exam Veronica Rowe is a 82 y.o. female who presents today for her Complete Annual Exam. She feels well. She reports exercising walking. She reports she is sleeping well. Breast complaints none.  Mammogram: 05/2022 DEXA: 06/2022 Colonoscopy: 2007 discontinued  Health Maintenance Due  Topic Date Due   Zoster Vaccines- Shingrix (1 of 2) Never done   Medicare Annual Wellness (AWV)  12/30/2020   COVID-19 Vaccine (1 - 2023-24 season) Never done   INFLUENZA VACCINE  03/15/2023    Immunization History  Administered Date(s) Administered   Fluad Quad(high Dose 65+) 07/04/2019   Pneumococcal Conjugate-13 01/04/2016   Pneumococcal Polysaccharide-23 10/05/2011   Tdap 10/28/2013    Hypertension This is a chronic problem. The problem is controlled. Pertinent negatives include no chest pain, headaches, palpitations or shortness of breath. Past treatments include angiotensin blockers and diuretics. Hypertensive end-organ damage includes kidney disease.  Depression        This is a chronic problem.The problem is unchanged.  Associated symptoms include no fatigue and no headaches.  Past treatments include SSRIs - Selective serotonin reuptake inhibitors and other medications. Hyperlipidemia This is a chronic problem. The problem is controlled. Pertinent negatives include no chest pain or shortness of breath. Current antihyperlipidemic treatment includes statins.    Lab Results  Component Value Date   NA 143 02/10/2022   K 4.5 02/10/2022   CO2 22 02/10/2022   GLUCOSE 93 02/10/2022   BUN 32 (H) 02/10/2022   CREATININE 1.38 (H) 02/10/2022   CALCIUM 10.1 02/10/2022   EGFR 39 (L) 02/10/2022   GFRNONAA 34 (L) 05/07/2020   Lab Results  Component Value Date   CHOL 176 02/10/2022   HDL 62 02/10/2022   LDLCALC 93 02/10/2022   TRIG 118 02/10/2022   CHOLHDL 2.8 02/10/2022   Lab  Results  Component Value Date   TSH 1.620 02/10/2022   Lab Results  Component Value Date   HGBA1C 5.7 (H) 02/10/2022   Lab Results  Component Value Date   WBC 8.3 02/10/2022   HGB 13.9 02/10/2022   HCT 43.2 02/10/2022   MCV 83 02/10/2022   PLT 266 02/10/2022   Lab Results  Component Value Date   ALT 9 02/10/2022   AST 17 02/10/2022   ALKPHOS 91 02/10/2022   BILITOT 0.4 02/10/2022   Lab Results  Component Value Date   VD25OH 57.4 12/24/2020     Review of Systems  Constitutional:  Negative for chills, fatigue and fever.  HENT:  Positive for hearing loss. Negative for congestion, tinnitus, trouble swallowing and voice change.   Eyes:  Negative for visual disturbance.  Respiratory:  Negative for cough, chest tightness, shortness of breath and wheezing.   Cardiovascular:  Negative for chest pain, palpitations and leg swelling.  Gastrointestinal:  Negative for abdominal pain, constipation, diarrhea and vomiting.  Endocrine: Negative for polydipsia and polyuria.  Genitourinary:  Negative for dysuria, frequency, genital sores, vaginal bleeding and vaginal discharge.  Musculoskeletal:  Negative for arthralgias, gait problem and joint swelling.  Skin:  Negative for color change and rash.  Neurological:  Negative for dizziness, tremors, light-headedness and headaches.  Hematological:  Negative for adenopathy. Does not bruise/bleed easily.  Psychiatric/Behavioral:  Positive for depression. Negative for dysphoric mood and sleep disturbance. The patient is not nervous/anxious.     Patient Active Problem List  Diagnosis Date Noted   Secondary hyperparathyroidism of renal origin (HCC) 02/10/2022   Osteoarthritis of fingers of both hands 01/02/2020   Lumbar spondylosis 03/27/2016   Vitamin D deficiency 08/17/2015   Essential hypertension 07/07/2015   Major depressive disorder with single episode, in partial remission (HCC) 07/07/2015   CKD (chronic kidney disease) stage 4, GFR  15-29 ml/min (HCC) 06/26/2015   Hx of peptic ulcer 06/26/2015   Generalized psoriasis 06/26/2015   Stress incontinence, female 06/26/2015   Dyslipidemia 06/26/2015   Generalized anxiety disorder 06/26/2015    Allergies  Allergen Reactions   Codeine Itching and Other (See Comments)    hallucinations    Past Surgical History:  Procedure Laterality Date   BREAST EXCISIONAL BIOPSY Right 2013   The patient's Dondra Spry model risk for breast cancer is 1.4% for the next 5 years, 4.4% lifetime    Social History   Tobacco Use   Smoking status: Never   Smokeless tobacco: Never  Vaping Use   Vaping status: Never Used  Substance Use Topics   Alcohol use: No    Alcohol/week: 0.0 standard drinks of alcohol   Drug use: No     Medication list has been reviewed and updated.  Current Meds  Medication Sig   acetaminophen (TYLENOL) 325 MG tablet Take 650 mg by mouth every 6 (six) hours as needed.   Ascorbic Acid (VITAMIN C) 500 MG CAPS Take 500 mg by mouth daily.   cholecalciferol (VITAMIN D) 1000 UNITS tablet Take 1,000 Units by mouth daily.   fluticasone (FLONASE) 50 MCG/ACT nasal spray Place 2 sprays into both nostrils daily.   mirtazapine (REMERON) 15 MG tablet TAKE 1 TABLET BY MOUTH AT BEDTIME   sertraline (ZOLOFT) 100 MG tablet TAKE 1 AND 1/2 TABLETS BY MOUTH ONCE DAILY   simvastatin (ZOCOR) 20 MG tablet TAKE ONE TABLET BY MOUTH EVERY EVENING   traMADol (ULTRAM) 50 MG tablet TAKE ONE TABLET BY MOUTH ONCE DAILY AS NEEDED FOR SEVERE PAIN   zinc gluconate 50 MG tablet Take 50 mg by mouth daily.   [DISCONTINUED] ALPRAZolam (XANAX) 0.25 MG tablet TAKE 1 TABLET BY MOUTH 2 TIMES DAILY AS NEEDED FOR ANXIETY   [DISCONTINUED] losartan (COZAAR) 100 MG tablet TAKE 1 TABLET BY MOUTH DAILY   [DISCONTINUED] omeprazole (PRILOSEC) 20 MG capsule TAKE 1 CAPSULE BY MOUTH TWO TIMES DAILY   [DISCONTINUED] triamterene-hydrochlorothiazide (MAXZIDE-25) 37.5-25 MG tablet TAKE 1 TABLET BY MOUTH DAILY        04/13/2023   10:42 AM 02/10/2022   10:50 AM 07/01/2021   10:32 AM 12/24/2020   11:02 AM  GAD 7 : Generalized Anxiety Score  Nervous, Anxious, on Edge 1 1 0 1  Control/stop worrying 1 1 0 3  Worry too much - different things 1 1 0 3  Trouble relaxing 0 0 0 1  Restless 0 0 0 0  Easily annoyed or irritable 0 0 0 0  Afraid - awful might happen 0 1 0 1  Total GAD 7 Score 3 4 0 9  Anxiety Difficulty Not difficult at all Not difficult at all Not difficult at all Not difficult at all       04/13/2023   10:42 AM 02/10/2022   10:50 AM 07/01/2021   10:32 AM  Depression screen PHQ 2/9  Decreased Interest 1 0 0  Down, Depressed, Hopeless 1 0 0  PHQ - 2 Score 2 0 0  Altered sleeping 0 0 0  Tired, decreased energy 1 0 1  Change  in appetite 0 0 0  Feeling bad or failure about yourself  0 0 0  Trouble concentrating 1 0 0  Moving slowly or fidgety/restless 0 0 0  Suicidal thoughts 0 0 0  PHQ-9 Score 4 0 1  Difficult doing work/chores Not difficult at all Not difficult at all Not difficult at all    BP Readings from Last 3 Encounters:  04/13/23 102/68  02/10/22 128/70  07/01/21 118/62    Physical Exam Vitals and nursing note reviewed.  Constitutional:      General: She is not in acute distress.    Appearance: She is well-developed.  HENT:     Head: Normocephalic and atraumatic.     Right Ear: Tympanic membrane and ear canal normal. Decreased hearing noted.     Left Ear: Tympanic membrane and ear canal normal. Decreased hearing noted.     Nose:     Right Sinus: No maxillary sinus tenderness.     Left Sinus: No maxillary sinus tenderness.  Eyes:     General: No scleral icterus.       Right eye: No discharge.        Left eye: No discharge.     Conjunctiva/sclera: Conjunctivae normal.  Neck:     Thyroid: No thyromegaly.     Vascular: No carotid bruit.  Cardiovascular:     Rate and Rhythm: Normal rate and regular rhythm.     Pulses: Normal pulses.     Heart sounds: Normal heart  sounds.  Pulmonary:     Effort: Pulmonary effort is normal. No respiratory distress.     Breath sounds: No wheezing.  Chest:  Breasts:    Right: No mass, nipple discharge, skin change or tenderness.     Left: No mass, nipple discharge, skin change or tenderness.  Abdominal:     General: Bowel sounds are normal.     Palpations: Abdomen is soft.     Tenderness: There is no abdominal tenderness.  Musculoskeletal:     Cervical back: Normal range of motion. No erythema.     Right lower leg: No edema.     Left lower leg: No edema.  Lymphadenopathy:     Cervical: No cervical adenopathy.  Skin:    General: Skin is warm and dry.     Findings: No rash.  Neurological:     Mental Status: She is alert and oriented to person, place, and time.     Cranial Nerves: No cranial nerve deficit.     Sensory: No sensory deficit.     Deep Tendon Reflexes: Reflexes are normal and symmetric.  Psychiatric:        Attention and Perception: Attention normal.        Mood and Affect: Mood normal.     Wt Readings from Last 3 Encounters:  04/13/23 164 lb (74.4 kg)  02/10/22 164 lb (74.4 kg)  07/01/21 162 lb 12.8 oz (73.8 kg)    BP 102/68   Pulse 88   Ht 5\' 4"  (1.626 m)   Wt 164 lb (74.4 kg)   SpO2 97%   BMI 28.15 kg/m   Assessment and Plan:  Problem List Items Addressed This Visit       Unprioritized   Vitamin D deficiency    On daily supplement      Relevant Orders   VITAMIN D 25 Hydroxy (Vit-D Deficiency, Fractures)   Secondary hyperparathyroidism of renal origin (HCC)   Major depressive disorder with single episode, in partial remission (HCC) (  Chronic)    Clinically stable on current regimen with good control of symptoms, No SI or HI. No change in management at this time. Sertraline and Remeron      Relevant Medications   ALPRAZolam (XANAX) 0.25 MG tablet   Hx of peptic ulcer    On daily PPI      Generalized anxiety disorder (Chronic)    Continues on PRN Xanax       Relevant Medications   ALPRAZolam (XANAX) 0.25 MG tablet   Essential hypertension (Chronic)    Normal exam with stable BP on losartan and HCTZ No concerns or side effects to current medication. No change in regimen; continue low sodium diet.       Relevant Medications   triamterene-hydrochlorothiazide (MAXZIDE-25) 37.5-25 MG tablet   losartan (COZAAR) 100 MG tablet   Other Relevant Orders   CBC with Differential/Platelet   Dyslipidemia (Chronic)    LDL is  Lab Results  Component Value Date   LDLCALC 93 02/10/2022   Currently being treated with simvastatin with good compliance and no concerns.       Relevant Orders   Lipid panel   CKD (chronic kidney disease) stage 4, GFR 15-29 ml/min (HCC) (Chronic)    Being followed closely      Relevant Orders   Comprehensive metabolic panel   Other Visit Diagnoses     Annual physical exam    -  Primary   Encounter for screening mammogram for breast cancer       Relevant Orders   MM 3D SCREENING MAMMOGRAM BILATERAL BREAST       Return in about 6 months (around 10/12/2023) for HTN, Depression.    Reubin Milan, MD [facility] Primary Care and Sports Medicine [place]

## 2023-04-13 NOTE — Patient Instructions (Signed)
Call [facility] to schedule your mammogram at [phone].  

## 2023-04-13 NOTE — Assessment & Plan Note (Addendum)
Normal exam with stable BP on losartan and HCTZ No concerns or side effects to current medication. No change in regimen; continue low sodium diet.

## 2023-04-14 LAB — CBC WITH DIFFERENTIAL/PLATELET
Basophils Absolute: 0.1 10*3/uL (ref 0.0–0.2)
Basos: 1 %
EOS (ABSOLUTE): 0.2 10*3/uL (ref 0.0–0.4)
Eos: 3 %
Hematocrit: 38.8 % (ref 34.0–46.6)
Hemoglobin: 13 g/dL (ref 11.1–15.9)
Immature Grans (Abs): 0 10*3/uL (ref 0.0–0.1)
Immature Granulocytes: 0 %
Lymphocytes Absolute: 1.4 10*3/uL (ref 0.7–3.1)
Lymphs: 18 %
MCH: 27.8 pg (ref 26.6–33.0)
MCHC: 33.5 g/dL (ref 31.5–35.7)
MCV: 83 fL (ref 79–97)
Monocytes Absolute: 0.6 10*3/uL (ref 0.1–0.9)
Monocytes: 8 %
Neutrophils Absolute: 5.5 10*3/uL (ref 1.4–7.0)
Neutrophils: 70 %
Platelets: 254 10*3/uL (ref 150–450)
RBC: 4.68 x10E6/uL (ref 3.77–5.28)
RDW: 13.1 % (ref 11.7–15.4)
WBC: 7.8 10*3/uL (ref 3.4–10.8)

## 2023-04-14 LAB — COMPREHENSIVE METABOLIC PANEL
ALT: 8 IU/L (ref 0–32)
AST: 16 IU/L (ref 0–40)
Albumin: 4.6 g/dL (ref 3.8–4.8)
Alkaline Phosphatase: 97 IU/L (ref 44–121)
BUN/Creatinine Ratio: 21 (ref 12–28)
BUN: 28 mg/dL — ABNORMAL HIGH (ref 8–27)
Bilirubin Total: 0.5 mg/dL (ref 0.0–1.2)
CO2: 22 mmol/L (ref 20–29)
Calcium: 9.6 mg/dL (ref 8.7–10.3)
Chloride: 102 mmol/L (ref 96–106)
Creatinine, Ser: 1.34 mg/dL — ABNORMAL HIGH (ref 0.57–1.00)
Globulin, Total: 2.8 g/dL (ref 1.5–4.5)
Glucose: 100 mg/dL — ABNORMAL HIGH (ref 70–99)
Potassium: 4.2 mmol/L (ref 3.5–5.2)
Sodium: 141 mmol/L (ref 134–144)
Total Protein: 7.4 g/dL (ref 6.0–8.5)
eGFR: 40 mL/min/{1.73_m2} — ABNORMAL LOW (ref >59)

## 2023-04-14 LAB — VITAMIN D 25 HYDROXY (VIT D DEFICIENCY, FRACTURES): Vit D, 25-Hydroxy: 46 ng/mL (ref 30.0–100.0)

## 2023-04-14 LAB — LIPID PANEL
Chol/HDL Ratio: 2.8 ratio (ref 0.0–4.4)
Cholesterol, Total: 160 mg/dL (ref 100–199)
HDL: 57 mg/dL (ref >39)
LDL Chol Calc (NIH): 82 mg/dL (ref 0–99)
Triglycerides: 121 mg/dL (ref 0–149)
VLDL Cholesterol Cal: 21 mg/dL (ref 5–40)

## 2023-04-17 ENCOUNTER — Other Ambulatory Visit: Payer: Self-pay | Admitting: Internal Medicine

## 2023-04-17 DIAGNOSIS — I10 Essential (primary) hypertension: Secondary | ICD-10-CM

## 2023-06-11 DIAGNOSIS — I10 Essential (primary) hypertension: Secondary | ICD-10-CM | POA: Diagnosis not present

## 2023-06-11 DIAGNOSIS — N1832 Chronic kidney disease, stage 3b: Secondary | ICD-10-CM | POA: Diagnosis not present

## 2023-06-11 DIAGNOSIS — N2581 Secondary hyperparathyroidism of renal origin: Secondary | ICD-10-CM | POA: Diagnosis not present

## 2023-06-15 ENCOUNTER — Ambulatory Visit
Admission: RE | Admit: 2023-06-15 | Discharge: 2023-06-15 | Disposition: A | Discharge status: Home / Self Care | Payer: PPO | Source: Ambulatory Visit | Attending: Internal Medicine | Admitting: Internal Medicine

## 2023-06-15 DIAGNOSIS — Z1231 Encounter for screening mammogram for malignant neoplasm of breast: Secondary | ICD-10-CM | POA: Insufficient documentation

## 2023-07-06 ENCOUNTER — Other Ambulatory Visit: Payer: Self-pay | Admitting: Internal Medicine

## 2023-07-06 DIAGNOSIS — F324 Major depressive disorder, single episode, in partial remission: Secondary | ICD-10-CM

## 2023-07-09 NOTE — Telephone Encounter (Signed)
Requested Prescriptions  Pending Prescriptions Disp Refills   sertraline (ZOLOFT) 100 MG tablet [Pharmacy Med Name: SERTRALINE HCL 100 MG TAB] 135 tablet 0    Sig: TAKE 1 AND 1/2 TABLETS BY MOUTH ONCE DAILY     Psychiatry:  Antidepressants - SSRI - sertraline Passed - 07/06/2023 10:15 AM      Passed - AST in normal range and within 360 days    AST  Date Value Ref Range Status  04/13/2023 16 0 - 40 IU/L Final         Passed - ALT in normal range and within 360 days    ALT  Date Value Ref Range Status  04/13/2023 8 0 - 32 IU/L Final         Passed - Completed PHQ-2 or PHQ-9 in the last 360 days      Passed - Valid encounter within last 6 months    Recent Outpatient Visits           2 months ago Annual physical exam   [place] [facility] at [facility], Nyoka Cowden, MD   1 year ago Annual physical exam   [facility] Primary Care & Sports Medicine at [facility], Nyoka Cowden, MD   2 years ago CKD (chronic kidney disease) stage 4, GFR 15-29 ml/min (HCC)   [place] [facility] Medicine at [facility], Nyoka Cowden, MD   2 years ago CKD (chronic kidney disease) stage 4, GFR 15-29 ml/min [facility])   [place] Primary Care & Sports Medicine at [facility], Nyoka Cowden, MD   3 years ago Generalized anxiety disorder   [facility] Primary Care & Sports Medicine at [facility], Nyoka Cowden, MD       Future Appointments             In 3 months Judithann Graves, Nyoka Cowden, MD [facility] Primary Care & Sports Medicine at [facility], [facility]   In 9 months Judithann Graves, Nyoka Cowden, MD [facility] Primary Care & Sports Medicine at [facility], [facility]

## 2023-10-01 DIAGNOSIS — N2581 Secondary hyperparathyroidism of renal origin: Secondary | ICD-10-CM | POA: Diagnosis not present

## 2023-10-01 DIAGNOSIS — N1832 Chronic kidney disease, stage 3b: Secondary | ICD-10-CM | POA: Diagnosis not present

## 2023-10-01 DIAGNOSIS — I10 Essential (primary) hypertension: Secondary | ICD-10-CM | POA: Diagnosis not present

## 2023-10-01 DIAGNOSIS — R809 Proteinuria, unspecified: Secondary | ICD-10-CM | POA: Diagnosis not present

## 2023-10-01 LAB — COMPREHENSIVE METABOLIC PANEL
Calcium: 9.5 (ref 8.7–10.7)
eGFR: 36

## 2023-10-01 LAB — BASIC METABOLIC PANEL
BUN: 35 — AB (ref 4–21)
CO2: 27 — AB (ref 13–22)
Chloride: 102 (ref 99–108)
Creatinine: 1.5 — AB (ref 0.5–1.1)
Potassium: 4 meq/L (ref 3.5–5.1)
Sodium: 140 (ref 137–147)

## 2023-10-01 LAB — CBC AND DIFFERENTIAL
HCT: 36 (ref 36–46)
Hemoglobin: 11.9 — AB (ref 12.0–16.0)
Platelets: 327 10*3/uL (ref 150–400)
WBC: 8.3

## 2023-10-10 ENCOUNTER — Other Ambulatory Visit: Payer: Self-pay | Admitting: Internal Medicine

## 2023-10-10 DIAGNOSIS — F324 Major depressive disorder, single episode, in partial remission: Secondary | ICD-10-CM

## 2023-10-12 ENCOUNTER — Encounter: Payer: Self-pay | Admitting: Internal Medicine

## 2023-10-12 ENCOUNTER — Ambulatory Visit (INDEPENDENT_AMBULATORY_CARE_PROVIDER_SITE_OTHER): Payer: HMO | Admitting: Internal Medicine

## 2023-10-12 VITALS — BP 108/72 | HR 102 | Ht 64.0 in | Wt 158.0 lb

## 2023-10-12 DIAGNOSIS — F324 Major depressive disorder, single episode, in partial remission: Secondary | ICD-10-CM

## 2023-10-12 DIAGNOSIS — I10 Essential (primary) hypertension: Secondary | ICD-10-CM | POA: Diagnosis not present

## 2023-10-12 DIAGNOSIS — N184 Chronic kidney disease, stage 4 (severe): Secondary | ICD-10-CM | POA: Diagnosis not present

## 2023-10-12 NOTE — Assessment & Plan Note (Addendum)
 Being followed by Nephrology Recent labs stable  ~ 35

## 2023-10-12 NOTE — Assessment & Plan Note (Signed)
 Controlled BP with normal exam. Current regimen is losartan and hctz. Renal disease followed by Nephrology Will continue same medications; encourage continued reduced sodium diet.

## 2023-10-12 NOTE — Assessment & Plan Note (Addendum)
 Clinically stable on Remeron and sertraline with good response, No SI or HI reported. Doing well - enjoying needlework, [place].  Appetite and sleep are good. No change in management at this time.

## 2023-10-12 NOTE — Progress Notes (Signed)
 Date:  10/12/2023   Name:  Veronica Rowe   DOB:  October 05, 1940   MRN:  [id number]   Chief Complaint: Hypertension and Depression  Hypertension This is a chronic problem. The problem is controlled. Pertinent negatives include no chest pain, headaches, palpitations or shortness of breath. Past treatments include angiotensin blockers and diuretics. Hypertensive end-organ damage includes kidney disease.  Depression        This is a chronic problem.The problem is unchanged.  Associated symptoms include no fatigue and no headaches.  Past treatments include SSRIs - Selective serotonin reuptake inhibitors (and Remeron).   Review of Systems  Constitutional:  Negative for chills, fatigue, fever and unexpected weight change.  HENT:  Positive for hearing loss.   Eyes:  Negative for visual disturbance.  Respiratory:  Negative for cough, chest tightness, shortness of breath and wheezing.   Cardiovascular:  Negative for chest pain, palpitations and leg swelling.  Gastrointestinal:  Negative for abdominal pain, constipation and diarrhea.  Genitourinary:  Negative for dysuria.  Musculoskeletal:  Positive for arthralgias (hands).  Neurological:  Negative for dizziness, weakness, light-headedness and headaches.  Psychiatric/Behavioral:  Positive for depression. Negative for dysphoric mood and sleep disturbance. The patient is not nervous/anxious.      Lab Results  Component Value Date   NA 140 10/01/2023   K 4.0 10/01/2023   CO2 27 (A) 10/01/2023   GLUCOSE 100 (H) 04/13/2023   BUN 35 (A) 10/01/2023   CREATININE 1.5 (A) 10/01/2023   CALCIUM 9.5 10/01/2023   EGFR 36 10/01/2023   GFRNONAA 34 (L) 05/07/2020   Lab Results  Component Value Date   CHOL 160 04/13/2023   HDL 57 04/13/2023   LDLCALC 82 04/13/2023   TRIG 121 04/13/2023   CHOLHDL 2.8 04/13/2023   Lab Results  Component Value Date   TSH 1.620 02/10/2022   Lab Results  Component Value Date   HGBA1C 5.7 (H) 02/10/2022   Lab  Results  Component Value Date   WBC 8.3 10/01/2023   HGB 11.9 (A) 10/01/2023   HCT 36 10/01/2023   MCV 83 04/13/2023   PLT 327 10/01/2023   Lab Results  Component Value Date   ALT 8 04/13/2023   AST 16 04/13/2023   ALKPHOS 97 04/13/2023   BILITOT 0.5 04/13/2023   Lab Results  Component Value Date   VD25OH 46.0 04/13/2023     Patient Active Problem List   Diagnosis Date Noted   Secondary hyperparathyroidism of renal origin (HCC) 02/10/2022   Osteoarthritis of fingers of both hands 01/02/2020   Lumbar spondylosis 03/27/2016   Vitamin D deficiency 08/17/2015   Essential hypertension 07/07/2015   Major depressive disorder with single episode, in partial remission (HCC) 07/07/2015   CKD (chronic kidney disease) stage 4, GFR 15-29 ml/min (HCC) 06/26/2015   Hx of peptic ulcer 06/26/2015   Generalized psoriasis 06/26/2015   Stress incontinence, female 06/26/2015   Dyslipidemia 06/26/2015   Generalized anxiety disorder 06/26/2015    Allergies  Allergen Reactions   Codeine Itching and Other (See Comments)    hallucinations    Past Surgical History:  Procedure Laterality Date   BREAST EXCISIONAL BIOPSY Right 2013   The patient's Dondra Spry model risk for breast cancer is 1.4% for the next 5 years, 4.4% lifetime    Social History   Tobacco Use   Smoking status: Never   Smokeless tobacco: Never  Vaping Use   Vaping status: Never Used  Substance Use Topics   Alcohol use:  No    Alcohol/week: 0.0 standard drinks of alcohol   Drug use: No     Medication list has been reviewed and updated.  Current Meds  Medication Sig   acetaminophen (TYLENOL) 325 MG tablet Take 650 mg by mouth every 6 (six) hours as needed.   ALPRAZolam (XANAX) 0.25 MG tablet Take 1 tablet (0.25 mg total) by mouth 2 (two) times daily as needed for anxiety.   Ascorbic Acid (VITAMIN C) 500 MG CAPS Take 500 mg by mouth daily.   cholecalciferol (VITAMIN D) 1000 UNITS tablet Take 1,000 Units by mouth daily.    fluticasone (FLONASE) 50 MCG/ACT nasal spray Place 2 sprays into both nostrils daily.   losartan (COZAAR) 100 MG tablet Take 1 tablet (100 mg total) by mouth daily.   mirtazapine (REMERON) 15 MG tablet TAKE 1 TABLET BY MOUTH AT BEDTIME   omeprazole (PRILOSEC) 20 MG capsule Take 1 capsule (20 mg total) by mouth 2 (two) times daily.   sertraline (ZOLOFT) 100 MG tablet TAKE 1 AND 1/2 TABLETS BY MOUTH ONCE DAILY   simvastatin (ZOCOR) 20 MG tablet TAKE ONE TABLET BY MOUTH EVERY EVENING   triamterene-hydrochlorothiazide (MAXZIDE-25) 37.5-25 MG tablet Take 1 tablet by mouth daily.   zinc gluconate 50 MG tablet Take 50 mg by mouth daily.       10/12/2023    9:24 AM 04/13/2023   10:42 AM 02/10/2022   10:50 AM 07/01/2021   10:32 AM  GAD 7 : Generalized Anxiety Score  Nervous, Anxious, on Edge 0 1 1 0  Control/stop worrying 0 1 1 0  Worry too much - different things 1 1 1  0  Trouble relaxing 0 0 0 0  Restless 0 0 0 0  Easily annoyed or irritable 0 0 0 0  Afraid - awful might happen 0 0 1 0  Total GAD 7 Score 1 3 4  0  Anxiety Difficulty Not difficult at all Not difficult at all Not difficult at all Not difficult at all       10/12/2023    9:23 AM 04/13/2023   10:42 AM 02/10/2022   10:50 AM  Depression screen PHQ 2/9  Decreased Interest 0 1 0  Down, Depressed, Hopeless 0 1 0  PHQ - 2 Score 0 2 0  Altered sleeping 0 0 0  Tired, decreased energy 0 1 0  Change in appetite 0 0 0  Feeling bad or failure about yourself  0 0 0  Trouble concentrating 0 1 0  Moving slowly or fidgety/restless 1 0 0  Suicidal thoughts 0 0 0  PHQ-9 Score 1 4 0  Difficult doing work/chores Not difficult at all Not difficult at all Not difficult at all    BP Readings from Last 3 Encounters:  10/12/23 108/72  04/13/23 102/68  02/10/22 128/70    Physical Exam Vitals and nursing note reviewed.  Constitutional:      General: She is not in acute distress.    Appearance: She is well-developed.  HENT:      Head: Normocephalic and atraumatic.  Cardiovascular:     Rate and Rhythm: Normal rate and regular rhythm.     Heart sounds: No murmur heard. Pulmonary:     Effort: Pulmonary effort is normal. No respiratory distress.     Breath sounds: No wheezing or rhonchi.  Musculoskeletal:     Cervical back: Normal range of motion and neck supple.  Skin:    General: Skin is warm and dry.  Findings: No rash.  Neurological:     Mental Status: She is alert and oriented to person, place, and time.  Psychiatric:        Mood and Affect: Mood normal.        Behavior: Behavior normal.     Wt Readings from Last 3 Encounters:  10/12/23 158 lb (71.7 kg)  04/13/23 164 lb (74.4 kg)  02/10/22 164 lb (74.4 kg)    BP 108/72   Pulse (!) 102   Ht 5\' 4"  (1.626 m)   Wt 158 lb (71.7 kg)   SpO2 98%   BMI 27.12 kg/m   Assessment and Plan:  Problem List Items Addressed This Visit       Unprioritized   CKD (chronic kidney disease) stage 4, GFR 15-29 ml/min (HCC) (Chronic)   Being followed by Nephrology Recent labs stable  ~ 35      Essential hypertension - Primary (Chronic)   Controlled BP with normal exam. Current regimen is losartan and hctz. Renal disease followed by Nephrology Will continue same medications; encourage continued reduced sodium diet.       Major depressive disorder with single episode, in partial remission (HCC) (Chronic)   Clinically stable on Remeron and sertraline with good response, No SI or HI reported. Doing well - enjoying needlework, [place].  Appetite and sleep are good. No change in management at this time.        No follow-ups on file.    Reubin Milan, MD [facility] [place]

## 2024-01-09 ENCOUNTER — Other Ambulatory Visit: Payer: Self-pay | Admitting: Internal Medicine

## 2024-01-09 DIAGNOSIS — F411 Generalized anxiety disorder: Secondary | ICD-10-CM

## 2024-01-11 ENCOUNTER — Other Ambulatory Visit: Payer: Self-pay | Admitting: Internal Medicine

## 2024-01-11 DIAGNOSIS — F324 Major depressive disorder, single episode, in partial remission: Secondary | ICD-10-CM

## 2024-01-11 NOTE — Telephone Encounter (Signed)
 Requested medications are due for refill today.  yes  Requested medications are on the active medications list.  yes  Last refill. 04/13/2023 #20 0 rf  Future visit scheduled.   yes  Notes to clinic.  Refill not delegated.    Requested Prescriptions  Pending Prescriptions Disp Refills   ALPRAZolam  (XANAX ) 0.25 MG tablet [Pharmacy Med Name: ALPRAZOLAM  0.25 MG TAB] 20 tablet     Sig: TAKE 1 TABLET BY MOUTH 2 TIMES DAILY AS NEEDED FOR ANXIETY.     Not Delegated - Psychiatry: Anxiolytics/Hypnotics 2 Failed - 01/11/2024  1:27 PM      Failed - This refill cannot be delegated      Failed - Urine Drug Screen completed in last 360 days      Passed - Patient is not pregnant      Passed - Valid encounter within last 6 months    Recent Outpatient Visits           3 months ago Essential hypertension    Primary Care & Sports Medicine at [facility], Chales [place], MD       Future Appointments             In 4 months Gala Jubilee, Chales [place], MD [facility] Primary [facility] Medicine at [facility], [facility]

## 2024-01-11 NOTE — Telephone Encounter (Signed)
 Please review.  KP

## 2024-01-30 ENCOUNTER — Other Ambulatory Visit: Payer: Self-pay | Admitting: Internal Medicine

## 2024-01-30 DIAGNOSIS — F324 Major depressive disorder, single episode, in partial remission: Secondary | ICD-10-CM

## 2024-02-04 DIAGNOSIS — I10 Essential (primary) hypertension: Secondary | ICD-10-CM | POA: Diagnosis not present

## 2024-02-04 DIAGNOSIS — R809 Proteinuria, unspecified: Secondary | ICD-10-CM | POA: Diagnosis not present

## 2024-02-04 DIAGNOSIS — N1832 Chronic kidney disease, stage 3b: Secondary | ICD-10-CM | POA: Diagnosis not present

## 2024-02-04 DIAGNOSIS — N2581 Secondary hyperparathyroidism of renal origin: Secondary | ICD-10-CM | POA: Diagnosis not present

## 2024-02-04 NOTE — Telephone Encounter (Signed)
 Requested Prescriptions  Pending Prescriptions Disp Refills   mirtazapine  (REMERON ) 15 MG tablet [Pharmacy Med Name: MIRTAZAPINE  15 MG TAB] 90 tablet 1    Sig: TAKE 1 TABLET BY MOUTH AT BEDTIME     Psychiatry: Antidepressants - mirtazapine  Passed - 02/04/2024  8:55 AM      Passed - Completed PHQ-2 or PHQ-9 in the last 360 days      Passed - Valid encounter within last 6 months    Recent Outpatient Visits           3 months ago Essential hypertension   [place] [facility] & [facility] Medicine at [facility], Leita DEL, MD       Future Appointments             In 3 months Justus, Leita DEL, MD [facility] Primary Care & Sports Medicine at [facility], [facility]

## 2024-03-26 ENCOUNTER — Other Ambulatory Visit: Payer: Self-pay | Admitting: Internal Medicine

## 2024-03-26 DIAGNOSIS — Z1231 Encounter for screening mammogram for malignant neoplasm of breast: Secondary | ICD-10-CM

## 2024-04-18 ENCOUNTER — Encounter: Payer: Self-pay | Admitting: Internal Medicine

## 2024-04-23 ENCOUNTER — Other Ambulatory Visit: Payer: Self-pay | Admitting: Internal Medicine

## 2024-04-23 DIAGNOSIS — F324 Major depressive disorder, single episode, in partial remission: Secondary | ICD-10-CM

## 2024-04-24 NOTE — Telephone Encounter (Signed)
 Requested medication (s) are due for refill today: yes  Requested medication (s) are on the active medication list: yes  Last refill:  01/11/24 #135  Future visit scheduled: yes  Notes to clinic:  overdue lab work    Requested Prescriptions  Pending Prescriptions Disp Refills   sertraline  (ZOLOFT ) 100 MG tablet [Pharmacy Med Name: SERTRALINE  HCL 100 MG TAB] 135 tablet 0    Sig: TAKE 1 AND 1/2 TABLETS BY MOUTH ONCE DAILY     Psychiatry:  Antidepressants - SSRI - sertraline  Failed - 04/24/2024 12:20 PM      Failed - AST in normal range and within 360 days    AST  Date Value Ref Range Status  04/13/2023 16 0 - 40 IU/L Final         Failed - ALT in normal range and within 360 days    ALT  Date Value Ref Range Status  04/13/2023 8 0 - 32 IU/L Final         Failed - Valid encounter within last 6 months    Recent Outpatient Visits           6 months ago Essential hypertension   [place] [facility] & [facility] Medicine at [facility], Leita DEL, MD       Future Appointments             In 3 weeks Justus Leita DEL, MD [facility] Primary Care & Sports Medicine at [facility], [phone] [place]            Passed - Completed PHQ-2 or PHQ-9 in the last 360 days

## 2024-04-30 ENCOUNTER — Other Ambulatory Visit: Payer: Self-pay | Admitting: Internal Medicine

## 2024-05-01 ENCOUNTER — Other Ambulatory Visit: Payer: Self-pay

## 2024-05-01 NOTE — Telephone Encounter (Signed)
 Requested medication (s) are due for refill today: yes  Requested medication (s) are on the active medication list: yes  Last refill:  03/20/23 #90 3 RF  Future visit scheduled: yes  Notes to clinic:  overdue lab work   Requested Prescriptions  Pending Prescriptions Disp Refills   simvastatin  (ZOCOR ) 20 MG tablet [Pharmacy Med Name: SIMVASTATIN  20 MG TAB] 90 tablet 3    Sig: TAKE ONE TABLET BY MOUTH EVERY EVENING     Cardiovascular:  Antilipid - Statins Failed - 05/01/2024  2:16 PM      Failed - Lipid Panel in normal range within the last 12 months    Cholesterol, Total  Date Value Ref Range Status  04/13/2023 160 100 - 199 mg/dL Final   LDL Chol Calc (NIH)  Date Value Ref Range Status  04/13/2023 82 0 - 99 mg/dL Final   HDL  Date Value Ref Range Status  04/13/2023 57 >39 mg/dL Final   Triglycerides  Date Value Ref Range Status  04/13/2023 121 0 - 149 mg/dL Final         Passed - Patient is not pregnant      Passed - Valid encounter within last 12 months    Recent Outpatient Visits           6 months ago Essential hypertension   [place] [facility] & [facility] Medicine at [facility], Leita DEL, MD       Future Appointments             In 2 weeks Justus, Leita DEL, MD [facility] Primary Care & Sports Medicine at [facility], [phone] [place]

## 2024-05-07 ENCOUNTER — Other Ambulatory Visit: Payer: Self-pay | Admitting: Internal Medicine

## 2024-05-07 DIAGNOSIS — I10 Essential (primary) hypertension: Secondary | ICD-10-CM

## 2024-05-08 NOTE — Telephone Encounter (Signed)
 Requested Prescriptions  Pending Prescriptions Disp Refills   triamterene -hydrochlorothiazide (MAXZIDE-25) 37.5-25 MG tablet [Pharmacy Med Name: TRIAMTERENE -HCTZ 37.5-25 MG TAB] 90 tablet 0    Sig: TAKE 1 TABLET BY MOUTH DAILY.     Cardiovascular: Diuretic Combos Failed - 05/08/2024  4:47 PM      Failed - K in normal range and within 180 days    Potassium  Date Value Ref Range Status  10/01/2023 4.0 3.5 - 5.1 mEq/L Final  12/28/2011 3.4 (L) 3.5 - 5.1 mmol/L Final         Failed - Na in normal range and within 180 days    Sodium  Date Value Ref Range Status  10/01/2023 140 137 - 147 Final         Failed - Cr in normal range and within 180 days    Creatinine  Date Value Ref Range Status  10/01/2023 1.5 (A) 0.5 - 1.1 Final   Creatinine, Ser  Date Value Ref Range Status  04/13/2023 1.34 (H) 0.57 - 1.00 mg/dL Final         Failed - Valid encounter within last 6 months    Recent Outpatient Visits           6 months ago Essential hypertension   [place] Primary Care & Sports Medicine at [facility], Leita DEL, MD       Future Appointments             In 1 week Justus Leita DEL, MD [facility] Primary Care & Sports Medicine at [facility], [phone] [place]            Passed - Last BP in normal range    BP Readings from Last 1 Encounters:  10/12/23 108/72          losartan  (COZAAR ) 100 MG tablet [Pharmacy Med Name: LOSARTAN  POTASSIUM 100 MG TAB] 90 tablet 0    Sig: TAKE 1 TABLET BY MOUTH DAILY.     Cardiovascular:  Angiotensin Receptor Blockers Failed - 05/08/2024  4:47 PM      Failed - Cr in normal range and within 180 days    Creatinine  Date Value Ref Range Status  10/01/2023 1.5 (A) 0.5 - 1.1 Final   Creatinine, Ser  Date Value Ref Range Status  04/13/2023 1.34 (H) 0.57 - 1.00 mg/dL Final         Failed - K in normal range and within 180 days    Potassium  Date Value Ref Range Status  10/01/2023 4.0 3.5 - 5.1 mEq/L Final  12/28/2011  3.4 (L) 3.5 - 5.1 mmol/L Final         Failed - Valid encounter within last 6 months    Recent Outpatient Visits           6 months ago Essential hypertension   [place] Primary Care & Sports Medicine at [facility], Leita DEL, MD       Future Appointments             In 1 week Justus Leita DEL, MD [facility] Primary Care & Sports Medicine at [facility], [phone] [place]            Passed - Patient is not pregnant      Passed - Last BP in normal range    BP Readings from Last 1 Encounters:  10/12/23 108/72

## 2024-05-16 ENCOUNTER — Ambulatory Visit (INDEPENDENT_AMBULATORY_CARE_PROVIDER_SITE_OTHER): Admitting: Internal Medicine

## 2024-05-16 ENCOUNTER — Encounter: Payer: Self-pay | Admitting: Internal Medicine

## 2024-05-16 VITALS — BP 119/74 | HR 83 | Ht 64.0 in | Wt 154.0 lb

## 2024-05-16 DIAGNOSIS — Z0000 Encounter for general adult medical examination without abnormal findings: Secondary | ICD-10-CM | POA: Diagnosis not present

## 2024-05-16 DIAGNOSIS — N184 Chronic kidney disease, stage 4 (severe): Secondary | ICD-10-CM | POA: Diagnosis not present

## 2024-05-16 DIAGNOSIS — Z1231 Encounter for screening mammogram for malignant neoplasm of breast: Secondary | ICD-10-CM

## 2024-05-16 DIAGNOSIS — F324 Major depressive disorder, single episode, in partial remission: Secondary | ICD-10-CM

## 2024-05-16 DIAGNOSIS — R7303 Prediabetes: Secondary | ICD-10-CM

## 2024-05-16 DIAGNOSIS — I10 Essential (primary) hypertension: Secondary | ICD-10-CM

## 2024-05-16 DIAGNOSIS — E785 Hyperlipidemia, unspecified: Secondary | ICD-10-CM

## 2024-05-16 DIAGNOSIS — Z8711 Personal history of peptic ulcer disease: Secondary | ICD-10-CM

## 2024-05-16 NOTE — Progress Notes (Signed)
 Date:  05/16/2024   Name:  Veronica Rowe   DOB:  [birth year]   MRN:  [id number]   Chief Complaint: Annual Exam Veronica Rowe is a 83 y.o. female who presents today for her Complete Annual Exam. She feels well. She reports exercising some. She reports she is sleeping well. Breast complaints - none.  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  Daughter is nearby and supportive.  Health Maintenance  Topic Date Due   Medicare Annual Wellness Visit  12/30/2020   DTaP/Tdap/Td vaccine (2 - Td or Tdap) 10/29/2023   Breast Cancer Screening  06/14/2024   COVID-19 Vaccine (1 - 2024-25 season) 06/01/2024*   Zoster (Shingles) Vaccine (1 of 2) 08/16/2024*   Flu Shot  11/11/2024*   Pneumococcal Vaccine for age over 55  Completed   DEXA scan (bone density measurement)  Completed   HPV Vaccine  Aged Out   Meningitis B Vaccine  Aged Out   Colon Cancer Screening  Discontinued   Hepatitis C Screening  Discontinued  *Topic was postponed. The date shown is not the original due date.    Hypertension This is a chronic problem. The problem is controlled. Pertinent negatives include no chest pain, headaches, palpitations or shortness of breath. Past treatments include angiotensin blockers and diuretics. The current treatment provides significant improvement. Hypertensive end-organ damage includes kidney disease and CAD/MI. There is no history of CVA.  Depression        This is a chronic problem.The problem is unchanged.  Associated symptoms include no fatigue, no myalgias and no headaches. Hyperlipidemia This is a chronic problem. The problem is controlled. Pertinent negatives include no chest pain, myalgias or shortness of breath. Current antihyperlipidemic treatment includes statins. The current treatment provides significant improvement of lipids.  CKD - followed by Nephrology.  Last GFR 47.  Review of Systems  Constitutional:  Negative for fatigue and  unexpected weight change.  HENT:  Positive for hearing loss. Negative for trouble swallowing.   Eyes:  Negative for visual disturbance.  Respiratory:  Negative for cough, chest tightness, shortness of breath and wheezing.   Cardiovascular:  Negative for chest pain, palpitations and leg swelling.  Gastrointestinal:  Negative for abdominal pain, constipation and diarrhea.  Musculoskeletal:  Negative for arthralgias and myalgias.  Skin:  Positive for rash (chronic psoriasis). Negative for color change.  Neurological:  Negative for dizziness, weakness, light-headedness and headaches.  Psychiatric/Behavioral:  Positive for depression. Negative for dysphoric mood and sleep disturbance. The patient is not nervous/anxious.      Lab Results  Component Value Date   NA 140 10/01/2023   K 4.0 10/01/2023   CO2 27 (A) 10/01/2023   GLUCOSE 100 (H) 04/13/2023   BUN 35 (A) 10/01/2023   CREATININE 1.5 (A) 10/01/2023   CALCIUM 9.5 10/01/2023   EGFR 36 10/01/2023   GFRNONAA 34 (L) 05/07/2020   Lab Results  Component Value Date   CHOL 160 04/13/2023   HDL 57 04/13/2023   LDLCALC 82 04/13/2023   TRIG 121 04/13/2023   CHOLHDL 2.8 04/13/2023   Lab Results  Component Value Date   TSH 1.620 02/10/2022   Lab Results  Component Value Date   HGBA1C 5.7 (H) 02/10/2022   Lab Results  Component Value Date   WBC 8.3 10/01/2023   HGB 11.9 (A) 10/01/2023   HCT 36 10/01/2023   MCV 83 04/13/2023   PLT 327 10/01/2023   Lab  Results  Component Value Date   ALT 8 04/13/2023   AST 16 04/13/2023   ALKPHOS 97 04/13/2023   BILITOT 0.5 04/13/2023   Lab Results  Component Value Date   VD25OH 46.0 04/13/2023     Patient Active Problem List   Diagnosis Date Noted   Secondary hyperparathyroidism of renal origin 02/10/2022   Osteoarthritis of fingers of both hands 01/02/2020   Lumbar spondylosis 03/27/2016   Vitamin D  deficiency 08/17/2015   Essential hypertension 07/07/2015   Major depressive  disorder with single episode, in partial remission 07/07/2015   CKD (chronic kidney disease) stage 4, GFR 15-29 ml/min (HCC) 06/26/2015   Hx of peptic ulcer 06/26/2015   Generalized psoriasis 06/26/2015   Stress incontinence, female 06/26/2015   Dyslipidemia 06/26/2015   Generalized anxiety disorder 06/26/2015    Allergies  Allergen Reactions   Codeine Itching and Other (See Comments)    hallucinations    Past Surgical History:  Procedure Laterality Date   BREAST EXCISIONAL BIOPSY Right 2013   The patient's Alisa model risk for breast cancer is 1.4% for the next 5 years, 4.4% lifetime    Social History   Tobacco Use   Smoking status: Never   Smokeless tobacco: Never  Vaping Use   Vaping status: Never Used  Substance Use Topics   Alcohol use: No    Alcohol/week: 0.0 standard drinks of alcohol   Drug use: No     Medication list has been reviewed and updated.  Current Meds  Medication Sig   acetaminophen (TYLENOL) 325 MG tablet Take 650 mg by mouth every 6 (six) hours as needed.   ALPRAZolam  (XANAX ) 0.25 MG tablet TAKE 1 TABLET BY MOUTH 2 TIMES DAILY AS NEEDED FOR ANXIETY.   Ascorbic Acid (VITAMIN C) 500 MG CAPS Take 500 mg by mouth daily.   cholecalciferol (VITAMIN D ) 1000 UNITS tablet Take 1,000 Units by mouth daily.   fluticasone (FLONASE) 50 MCG/ACT nasal spray Place 2 sprays into both nostrils daily.   losartan  (COZAAR ) 100 MG tablet TAKE 1 TABLET BY MOUTH DAILY.   mirtazapine  (REMERON ) 15 MG tablet TAKE 1 TABLET BY MOUTH AT BEDTIME   omeprazole  (PRILOSEC) 20 MG capsule Take 1 capsule (20 mg total) by mouth 2 (two) times daily.   sertraline  (ZOLOFT ) 100 MG tablet TAKE 1 AND 1/2 TABLETS BY MOUTH ONCE DAILY   simvastatin  (ZOCOR ) 20 MG tablet TAKE ONE TABLET BY MOUTH EVERY EVENING   triamterene -hydrochlorothiazide (MAXZIDE-25) 37.5-25 MG tablet TAKE 1 TABLET BY MOUTH DAILY.   zinc gluconate 50 MG tablet Take 50 mg by mouth daily.       05/16/2024   10:15 AM  10/12/2023    9:24 AM 04/13/2023   10:42 AM 02/10/2022   10:50 AM  GAD 7 : Generalized Anxiety Score  Nervous, Anxious, on Edge 0 0 1 1  Control/stop worrying 0 0 1 1  Worry too much - different things 0 1 1 1   Trouble relaxing 0 0 0 0  Restless 0 0 0 0  Easily annoyed or irritable 0 0 0 0  Afraid - awful might happen 0 0 0 1  Total GAD 7 Score 0 1 3 4   Anxiety Difficulty Not difficult at all Not difficult at all Not difficult at all Not difficult at all       05/16/2024   10:15 AM 10/12/2023    9:23 AM 04/13/2023   10:42 AM  Depression screen PHQ 2/9  Decreased Interest 0 0 1  Down, Depressed, Hopeless 0 0 1  PHQ - 2 Score 0 0 2  Altered sleeping 0 0 0  Tired, decreased energy 0 0 1  Change in appetite 0 0 0  Feeling bad or failure about yourself  0 0 0  Trouble concentrating 0 0 1  Moving slowly or fidgety/restless 0 1 0  Suicidal thoughts 0 0 0  PHQ-9 Score 0 1 4  Difficult doing work/chores Not difficult at all Not difficult at all Not difficult at all    BP Readings from Last 3 Encounters:  05/16/24 119/74  10/12/23 108/72  04/13/23 102/68    Physical Exam Vitals and nursing note reviewed.  Constitutional:      General: She is not in acute distress.    Appearance: She is well-developed.  HENT:     Head: Normocephalic and atraumatic.     Right Ear: Tympanic membrane and ear canal normal.     Left Ear: Tympanic membrane and ear canal normal.     Nose:     Right Sinus: No maxillary sinus tenderness.     Left Sinus: No maxillary sinus tenderness.  Eyes:     General: No scleral icterus.       Right eye: No discharge.        Left eye: No discharge.     Conjunctiva/sclera: Conjunctivae normal.  Neck:     Thyroid : No thyromegaly.     Vascular: No carotid bruit.  Cardiovascular:     Rate and Rhythm: Normal rate and regular rhythm.     Pulses: Normal pulses.     Heart sounds: Normal heart sounds.  Pulmonary:     Effort: Pulmonary effort is normal. No  respiratory distress.     Breath sounds: No wheezing.  Abdominal:     General: Bowel sounds are normal.     Palpations: Abdomen is soft.     Tenderness: There is no abdominal tenderness.  Musculoskeletal:     Cervical back: Normal range of motion. No erythema.     Right lower leg: No edema.     Left lower leg: No edema.  Lymphadenopathy:     Cervical: No cervical adenopathy.  Skin:    General: Skin is warm and dry.     Capillary Refill: Capillary refill takes less than 2 seconds.     Findings: Rash present.     Comments: Scaly silver rash on red base - scattered lesions on abdomen and lower back, lower legs  Neurological:     General: No focal deficit present.     Mental Status: She is alert and oriented to person, place, and time.     Cranial Nerves: No cranial nerve deficit.     Sensory: No sensory deficit.     Deep Tendon Reflexes: Reflexes are normal and symmetric.  Psychiatric:        Attention and Perception: Attention normal.        Mood and Affect: Mood normal.     Wt Readings from Last 3 Encounters:  05/16/24 154 lb (69.9 kg)  10/12/23 158 lb (71.7 kg)  04/13/23 164 lb (74.4 kg)    BP 119/74   Pulse 83   Ht 5' 4 (1.626 m)   Wt 154 lb (69.9 kg)   SpO2 98%   BMI 26.43 kg/m   Assessment and Plan:  Problem List Items Addressed This Visit       Unprioritized   CKD (chronic kidney disease) stage 4, GFR 15-29 ml/min (HCC) (  Chronic)   Stable, followed by Nephrology.      Relevant Orders   Comprehensive metabolic panel with GFR   Urinalysis, Routine w reflex microscopic   Dyslipidemia (Chronic)   LDL is  Lab Results  Component Value Date   LDLCALC 82 04/13/2023    Current medication regimen is simvastatin . Goal LDL is < 70.       Relevant Orders   Lipid panel   Essential hypertension (Chronic)   Well controlled blood pressure today. Current regimen is losartan  and hctz. No medication side effects noted. CKD followed by Nephrology.        Relevant Orders   CBC with Differential/Platelet   TSH   Hx of peptic ulcer   Currently taking omeprazole  daily with minimal reflux symptoms. Patient denies red flag symptoms - no melena, weight loss, dysphagia. Will maintain current management.       Major depressive disorder with single episode, in partial remission (Chronic)   Depression and anxiety symptoms are stable and well controlled on Sertraline  and Remeron  with prn xanax . No SI/HI reported. I recommend continuing the same medical regimen.       Other Visit Diagnoses       Annual physical exam    -  Primary   continue healthy diet, regular physical activity declines Flu and covid vaccines up to date on screenings     Encounter for screening mammogram for breast cancer       appt scheduled for November     Prediabetes       Relevant Orders   Hemoglobin A1c       Return in about 6 months (around 11/14/2024) for TOC - HTN, depression  Dr. Lemon.    Veronica HILARIO Adie, MD [facility] [place]

## 2024-05-16 NOTE — Assessment & Plan Note (Signed)
 Depression and anxiety symptoms are stable and well controlled on Sertraline  and Remeron  with prn xanax . No SI/HI reported. I recommend continuing the same medical regimen.

## 2024-05-16 NOTE — Assessment & Plan Note (Signed)
 Currently taking omeprazole  daily with minimal reflux symptoms. Patient denies red flag symptoms - no melena, weight loss, dysphagia. Will maintain current management.

## 2024-05-16 NOTE — Assessment & Plan Note (Signed)
Stable, followed by Nephrology ?

## 2024-05-16 NOTE — Assessment & Plan Note (Signed)
 Well controlled blood pressure today. Current regimen is losartan  and hctz. No medication side effects noted. CKD followed by Nephrology.

## 2024-05-16 NOTE — Assessment & Plan Note (Signed)
 LDL is  Lab Results  Component Value Date   LDLCALC 82 04/13/2023    Current medication regimen is simvastatin . Goal LDL is < 70.

## 2024-05-21 ENCOUNTER — Other Ambulatory Visit: Payer: Self-pay | Admitting: Internal Medicine

## 2024-05-21 DIAGNOSIS — F324 Major depressive disorder, single episode, in partial remission: Secondary | ICD-10-CM

## 2024-05-23 DIAGNOSIS — N184 Chronic kidney disease, stage 4 (severe): Secondary | ICD-10-CM | POA: Diagnosis not present

## 2024-05-23 DIAGNOSIS — E785 Hyperlipidemia, unspecified: Secondary | ICD-10-CM | POA: Diagnosis not present

## 2024-05-23 DIAGNOSIS — R7303 Prediabetes: Secondary | ICD-10-CM | POA: Diagnosis not present

## 2024-05-23 NOTE — Telephone Encounter (Signed)
 Too soon for refill, LRF 02/04/24 FOR 90 AND 1 RF.  Requested Prescriptions  Pending Prescriptions Disp Refills   mirtazapine  (REMERON ) 15 MG tablet [Pharmacy Med Name: MIRTAZAPINE  15 MG TAB] 90 tablet 1    Sig: TAKE 1 TABLET BY MOUTH AT BEDTIME     Psychiatry: Antidepressants - mirtazapine  Passed - 05/23/2024 12:04 PM      Passed - Completed PHQ-2 or PHQ-9 in the last 360 days      Passed - Valid encounter within last 6 months    Recent Outpatient Visits           1 week ago Annual physical exam   [facility] at [facility], Leita DEL, MD   7 months ago Essential hypertension   [facility] Primary Care [facility] Medicine at [facility], Leita DEL, MD

## 2024-05-24 LAB — COMPREHENSIVE METABOLIC PANEL WITH GFR
ALT: 6 IU/L (ref 0–32)
AST: 14 IU/L (ref 0–40)
Albumin: 4.5 g/dL (ref 3.7–4.7)
Alkaline Phosphatase: 103 IU/L (ref 48–129)
BUN/Creatinine Ratio: 23 (ref 12–28)
BUN: 31 mg/dL — ABNORMAL HIGH (ref 8–27)
Bilirubin Total: 0.4 mg/dL (ref 0.0–1.2)
CO2: 21 mmol/L (ref 20–29)
Calcium: 9.6 mg/dL (ref 8.7–10.3)
Chloride: 100 mmol/L (ref 96–106)
Creatinine, Ser: 1.37 mg/dL — ABNORMAL HIGH (ref 0.57–1.00)
Globulin, Total: 3 g/dL (ref 1.5–4.5)
Glucose: 91 mg/dL (ref 70–99)
Potassium: 5.1 mmol/L (ref 3.5–5.2)
Sodium: 138 mmol/L (ref 134–144)
Total Protein: 7.5 g/dL (ref 6.0–8.5)
eGFR: 39 mL/min/1.73 — ABNORMAL LOW (ref >59)

## 2024-05-24 LAB — CBC WITH DIFFERENTIAL/PLATELET
Basophils Absolute: 0.1 x10E3/uL (ref 0.0–0.2)
Basos: 1 %
EOS (ABSOLUTE): 0.3 x10E3/uL (ref 0.0–0.4)
Eos: 3 %
Hematocrit: 38.6 % (ref 34.0–46.6)
Hemoglobin: 12.2 g/dL (ref 11.1–15.9)
Immature Grans (Abs): 0 x10E3/uL (ref 0.0–0.1)
Immature Granulocytes: 0 %
Lymphocytes Absolute: 2.1 x10E3/uL (ref 0.7–3.1)
Lymphs: 20 %
MCH: 26.8 pg (ref 26.6–33.0)
MCHC: 31.6 g/dL (ref 31.5–35.7)
MCV: 85 fL (ref 79–97)
Monocytes Absolute: 0.7 x10E3/uL (ref 0.1–0.9)
Monocytes: 7 %
Neutrophils Absolute: 7 x10E3/uL (ref 1.4–7.0)
Neutrophils: 69 %
Platelets: 334 x10E3/uL (ref 150–450)
RBC: 4.56 x10E6/uL (ref 3.77–5.28)
RDW: 14.2 % (ref 11.7–15.4)
WBC: 10.2 x10E3/uL (ref 3.4–10.8)

## 2024-05-24 LAB — URINALYSIS, ROUTINE W REFLEX MICROSCOPIC
Bilirubin, UA: NEGATIVE
Glucose, UA: NEGATIVE
Ketones, UA: NEGATIVE
Nitrite, UA: POSITIVE — AB
Protein,UA: NEGATIVE
Specific Gravity, UA: 1.021 (ref 1.005–1.030)
Urobilinogen, Ur: 1 mg/dL (ref 0.2–1.0)
pH, UA: 6 (ref 5.0–7.5)

## 2024-05-24 LAB — HEMOGLOBIN A1C
Est. average glucose Bld gHb Est-mCnc: 114 mg/dL
Hgb A1c MFr Bld: 5.6 % (ref 4.8–5.6)

## 2024-05-24 LAB — MICROSCOPIC EXAMINATION
Casts: NONE SEEN /LPF
RBC, Urine: NONE SEEN /HPF (ref 0–2)

## 2024-05-24 LAB — LIPID PANEL
Chol/HDL Ratio: 2.7 ratio (ref 0.0–4.4)
Cholesterol, Total: 164 mg/dL (ref 100–199)
HDL: 60 mg/dL (ref >39)
LDL Chol Calc (NIH): 82 mg/dL (ref 0–99)
Triglycerides: 128 mg/dL (ref 0–149)
VLDL Cholesterol Cal: 22 mg/dL (ref 5–40)

## 2024-05-24 LAB — TSH: TSH: 1.39 u[IU]/mL (ref 0.450–4.500)

## 2024-05-26 ENCOUNTER — Ambulatory Visit: Payer: Self-pay | Admitting: Internal Medicine

## 2024-05-27 ENCOUNTER — Other Ambulatory Visit: Payer: Self-pay | Admitting: Internal Medicine

## 2024-05-27 DIAGNOSIS — F324 Major depressive disorder, single episode, in partial remission: Secondary | ICD-10-CM

## 2024-05-29 NOTE — Telephone Encounter (Signed)
 Requested Prescriptions  Pending Prescriptions Disp Refills   sertraline  (ZOLOFT ) 100 MG tablet [Pharmacy Med Name: SERTRALINE  HCL 100 MG TAB] 135 tablet 2    Sig: TAKE 1 AND 1/2 TABLETS BY MOUTH ONCE DAILY     Psychiatry:  Antidepressants - SSRI - sertraline  Passed - 05/29/2024  2:34 PM      Passed - AST in normal range and within 360 days    AST  Date Value Ref Range Status  05/23/2024 14 0 - 40 IU/L Final         Passed - ALT in normal range and within 360 days    ALT  Date Value Ref Range Status  05/23/2024 6 0 - 32 IU/L Final         Passed - Completed PHQ-2 or PHQ-9 in the last 360 days      Passed - Valid encounter within last 6 months    Recent Outpatient Visits           1 week ago Annual physical exam   [place] [facility] at [facility], Leita DEL, MD   7 months ago Essential hypertension   [place] [facility] at [facility], Leita DEL, MD               omeprazole  (PRILOSEC) 20 MG capsule [Pharmacy Med Name: OMEPRAZOLE  DR 20 MG CAP] 180 capsule 2    Sig: TAKE 1 CAPSULE BY MOUTH 2 TIMES DAILY.     Gastroenterology: Proton Pump Inhibitors Passed - 05/29/2024  2:34 PM      Passed - Valid encounter within last 12 months    Recent Outpatient Visits           1 week ago Annual physical exam   [facility] at [facility], Leita DEL, MD   7 months ago Essential hypertension   [facility] at [facility], Leita DEL, MD

## 2024-06-04 ENCOUNTER — Other Ambulatory Visit: Payer: Self-pay | Admitting: Internal Medicine

## 2024-06-06 NOTE — Telephone Encounter (Signed)
 Requested medication (s) are due for refill today: yes   Requested medication (s) are on the active medication list: yes   Last refill:  05/01/24 #30 0 refills  Future visit scheduled: yes 11/21/24  Notes to clinic:  no refills remain. Do you want to order more refills? Last OV 05/23/24     Requested Prescriptions  Pending Prescriptions Disp Refills   simvastatin  (ZOCOR ) 20 MG tablet [Pharmacy Med Name: SIMVASTATIN  20 MG TAB] 30 tablet 0    Sig: TAKE ONE TABLET BY MOUTH EVERY EVENING     Cardiovascular:  Antilipid - Statins Failed - 06/06/2024 11:57 AM      Failed - Lipid Panel in normal range within the last 12 months    Cholesterol, Total  Date Value Ref Range Status  05/23/2024 164 100 - 199 mg/dL Final   LDL Chol Calc (NIH)  Date Value Ref Range Status  05/23/2024 82 0 - 99 mg/dL Final   HDL  Date Value Ref Range Status  05/23/2024 60 >39 mg/dL Final   Triglycerides  Date Value Ref Range Status  05/23/2024 128 0 - 149 mg/dL Final         Passed - Patient is not pregnant      Passed - Valid encounter within last 12 months    Recent Outpatient Visits           3 weeks ago Annual physical exam    Primary Care & Sports Medicine at [facility], Leita DEL, MD   7 months ago Essential hypertension   [facility] Medicine at [facility], Leita DEL, MD

## 2024-06-20 ENCOUNTER — Ambulatory Visit
Admission: RE | Admit: 2024-06-20 | Discharge: 2024-06-20 | Disposition: A | Discharge status: Home / Self Care | Source: Ambulatory Visit | Attending: Internal Medicine | Admitting: Internal Medicine

## 2024-06-20 DIAGNOSIS — Z1231 Encounter for screening mammogram for malignant neoplasm of breast: Secondary | ICD-10-CM | POA: Insufficient documentation

## 2024-06-23 DIAGNOSIS — R8290 Unspecified abnormal findings in urine: Secondary | ICD-10-CM | POA: Diagnosis not present

## 2024-06-23 DIAGNOSIS — N184 Chronic kidney disease, stage 4 (severe): Secondary | ICD-10-CM | POA: Diagnosis not present

## 2024-06-23 DIAGNOSIS — I10 Essential (primary) hypertension: Secondary | ICD-10-CM | POA: Diagnosis not present

## 2024-07-14 ENCOUNTER — Other Ambulatory Visit: Payer: Self-pay | Admitting: Internal Medicine

## 2024-07-17 NOTE — Telephone Encounter (Signed)
 Requested Prescriptions  Pending Prescriptions Disp Refills   simvastatin  (ZOCOR ) 20 MG tablet [Pharmacy Med Name: SIMVASTATIN  20 MG TAB] 90 tablet 3    Sig: TAKE ONE TABLET BY MOUTH EVERY EVENING     Cardiovascular:  Antilipid - Statins Failed - 07/17/2024 11:57 AM      Failed - Lipid Panel in normal range within the last 12 months    Cholesterol, Total  Date Value Ref Range Status  05/23/2024 164 100 - 199 mg/dL Final   LDL Chol Calc (NIH)  Date Value Ref Range Status  05/23/2024 82 0 - 99 mg/dL Final   HDL  Date Value Ref Range Status  05/23/2024 60 >39 mg/dL Final   Triglycerides  Date Value Ref Range Status  05/23/2024 128 0 - 149 mg/dL Final         Passed - Patient is not pregnant      Passed - Valid encounter within last 12 months    Recent Outpatient Visits           2 months ago Annual physical exam   [place] [facility] at [facility], Leita DEL, MD   9 months ago Essential hypertension   [facility] Primary Care & Sports Medicine at [facility], Leita DEL, MD

## 2024-08-08 ENCOUNTER — Other Ambulatory Visit: Payer: Self-pay | Admitting: Internal Medicine

## 2024-08-08 DIAGNOSIS — I10 Essential (primary) hypertension: Secondary | ICD-10-CM

## 2024-08-11 NOTE — Telephone Encounter (Signed)
 Requested Prescriptions  Pending Prescriptions Disp Refills   triamterene -hydrochlorothiazide (MAXZIDE-25) 37.5-25 MG tablet [Pharmacy Med Name: TRIAMTERENE -HCTZ 37.5-25 MG TAB] 90 tablet 1    Sig: TAKE 1 TABLET BY MOUTH ONCE DAILY     Cardiovascular: Diuretic Combos Failed - 08/11/2024 12:33 PM      Failed - Cr in normal range and within 180 days    Creatinine, Ser  Date Value Ref Range Status  05/23/2024 1.37 (H) 0.57 - 1.00 mg/dL Final         Passed - K in normal range and within 180 days    Potassium  Date Value Ref Range Status  05/23/2024 5.1 3.5 - 5.2 mmol/L Final  12/28/2011 3.4 (L) 3.5 - 5.1 mmol/L Final         Passed - Na in normal range and within 180 days    Sodium  Date Value Ref Range Status  05/23/2024 138 134 - 144 mmol/L Final         Passed - Last BP in normal range    BP Readings from Last 1 Encounters:  05/16/24 119/74         Passed - Valid encounter within last 6 months    Recent Outpatient Visits           2 months ago Annual physical exam   [place] [facility] at [facility], Leita DEL, MD   10 months ago Essential hypertension   [place] [facility] Medicine at [facility], Leita DEL, MD               losartan  (COZAAR ) 100 MG tablet [Pharmacy Med Name: LOSARTAN  POTASSIUM 100 MG TAB] 90 tablet 1    Sig: TAKE 1 TABLET BY MOUTH DAILY.     Cardiovascular:  Angiotensin Receptor Blockers Failed - 08/11/2024 12:33 PM      Failed - Cr in normal range and within 180 days    Creatinine, Ser  Date Value Ref Range Status  05/23/2024 1.37 (H) 0.57 - 1.00 mg/dL Final         Passed - K in normal range and within 180 days    Potassium  Date Value Ref Range Status  05/23/2024 5.1 3.5 - 5.2 mmol/L Final  12/28/2011 3.4 (L) 3.5 - 5.1 mmol/L Final         Passed - Patient is not pregnant      Passed - Last BP in normal range    BP Readings from Last 1 Encounters:  05/16/24 119/74          Passed - Valid encounter within last 6 months    Recent Outpatient Visits           2 months ago Annual physical exam   [facility] Primary Care & [facility] Medicine at [facility], Leita DEL, MD   10 months ago Essential hypertension   [facility] Primary Care [facility] Medicine at [facility], Leita DEL, MD

## 2024-08-28 ENCOUNTER — Other Ambulatory Visit: Payer: Self-pay

## 2024-08-28 DIAGNOSIS — F324 Major depressive disorder, single episode, in partial remission: Secondary | ICD-10-CM

## 2024-08-28 MED ORDER — MIRTAZAPINE 15 MG PO TABS: 15.0000 mg | ORAL_TABLET | Freq: Every day | ORAL | 0 refills | Status: AC

## 2024-11-17 ENCOUNTER — Encounter: Admitting: Student

## 2024-11-21 ENCOUNTER — Encounter: Admitting: Student
